# Patient Record
Sex: Female | Born: 1935 | Race: Black or African American | Hispanic: No | Smoking: Never smoker
Health system: Southern US, Community
[De-identification: ages and names within clinical notes are randomized; demographics above are authoritative.]

## PROBLEM LIST (undated history)

## (undated) DIAGNOSIS — H9190 Unspecified hearing loss, unspecified ear: Secondary | ICD-10-CM

## (undated) DIAGNOSIS — Z8489 Family history of other specified conditions: Secondary | ICD-10-CM

## (undated) DIAGNOSIS — E039 Hypothyroidism, unspecified: Secondary | ICD-10-CM

## (undated) DIAGNOSIS — I10 Essential (primary) hypertension: Secondary | ICD-10-CM

## (undated) DIAGNOSIS — I639 Cerebral infarction, unspecified: Secondary | ICD-10-CM

## (undated) DIAGNOSIS — M1990 Unspecified osteoarthritis, unspecified site: Secondary | ICD-10-CM

## (undated) DIAGNOSIS — J189 Pneumonia, unspecified organism: Secondary | ICD-10-CM

## (undated) DIAGNOSIS — R0602 Shortness of breath: Secondary | ICD-10-CM

## (undated) DIAGNOSIS — Z9289 Personal history of other medical treatment: Secondary | ICD-10-CM

## (undated) DIAGNOSIS — K219 Gastro-esophageal reflux disease without esophagitis: Secondary | ICD-10-CM

## (undated) DIAGNOSIS — F0390 Unspecified dementia without behavioral disturbance: Secondary | ICD-10-CM

## (undated) DIAGNOSIS — D649 Anemia, unspecified: Secondary | ICD-10-CM

## (undated) DIAGNOSIS — C50919 Malignant neoplasm of unspecified site of unspecified female breast: Secondary | ICD-10-CM

## (undated) DIAGNOSIS — R011 Cardiac murmur, unspecified: Secondary | ICD-10-CM

## (undated) DIAGNOSIS — I509 Heart failure, unspecified: Secondary | ICD-10-CM

## (undated) DIAGNOSIS — I4891 Unspecified atrial fibrillation: Secondary | ICD-10-CM

## (undated) DIAGNOSIS — E7800 Pure hypercholesterolemia, unspecified: Secondary | ICD-10-CM

## (undated) DIAGNOSIS — F419 Anxiety disorder, unspecified: Secondary | ICD-10-CM

## (undated) DIAGNOSIS — Z8719 Personal history of other diseases of the digestive system: Secondary | ICD-10-CM

## (undated) HISTORY — PX: BREAST BIOPSY: SHX20

## (undated) HISTORY — PX: BREAST LUMPECTOMY: SHX2

## (undated) HISTORY — PX: ABDOMINAL HYSTERECTOMY: SHX81

## (undated) HISTORY — PX: CATARACT EXTRACTION W/ INTRAOCULAR LENS  IMPLANT, BILATERAL: SHX1307

## (undated) HISTORY — PX: CARPAL TUNNEL RELEASE: SHX101

---

## 1987-09-29 DIAGNOSIS — E039 Hypothyroidism, unspecified: Secondary | ICD-10-CM

## 1987-09-29 HISTORY — DX: Hypothyroidism, unspecified: E03.9

## 1999-07-31 ENCOUNTER — Encounter: Payer: Self-pay | Admitting: Internal Medicine

## 1999-07-31 ENCOUNTER — Encounter: Admission: RE | Admit: 1999-07-31 | Discharge: 1999-07-31 | Payer: Self-pay | Admitting: Internal Medicine

## 2002-08-21 ENCOUNTER — Encounter: Admission: RE | Admit: 2002-08-21 | Discharge: 2002-08-21 | Payer: Self-pay | Admitting: Internal Medicine

## 2002-08-21 ENCOUNTER — Encounter: Payer: Self-pay | Admitting: Internal Medicine

## 2002-09-08 ENCOUNTER — Ambulatory Visit (HOSPITAL_COMMUNITY): Admission: RE | Admit: 2002-09-08 | Discharge: 2002-09-08 | Payer: Self-pay | Admitting: Internal Medicine

## 2002-12-25 ENCOUNTER — Ambulatory Visit (HOSPITAL_COMMUNITY): Admission: RE | Admit: 2002-12-25 | Discharge: 2002-12-25 | Payer: Self-pay | Admitting: *Deleted

## 2005-03-05 ENCOUNTER — Encounter: Admission: RE | Admit: 2005-03-05 | Discharge: 2005-03-05 | Payer: Self-pay | Admitting: Internal Medicine

## 2005-10-16 ENCOUNTER — Encounter: Admission: RE | Admit: 2005-10-16 | Discharge: 2005-10-16 | Payer: Self-pay | Admitting: Internal Medicine

## 2006-03-15 ENCOUNTER — Emergency Department (HOSPITAL_COMMUNITY): Admission: EM | Admit: 2006-03-15 | Discharge: 2006-03-15 | Payer: Self-pay | Admitting: Emergency Medicine

## 2008-01-05 ENCOUNTER — Encounter: Admission: RE | Admit: 2008-01-05 | Discharge: 2008-01-05 | Payer: Self-pay | Admitting: Internal Medicine

## 2010-03-26 ENCOUNTER — Encounter: Admission: RE | Admit: 2010-03-26 | Discharge: 2010-03-26 | Payer: Self-pay | Admitting: Internal Medicine

## 2012-06-28 ENCOUNTER — Other Ambulatory Visit: Payer: Self-pay | Admitting: Internal Medicine

## 2012-06-28 DIAGNOSIS — R109 Unspecified abdominal pain: Secondary | ICD-10-CM

## 2012-06-28 DIAGNOSIS — K30 Functional dyspepsia: Secondary | ICD-10-CM

## 2012-06-28 DIAGNOSIS — R079 Chest pain, unspecified: Secondary | ICD-10-CM

## 2012-07-04 ENCOUNTER — Ambulatory Visit
Admission: RE | Admit: 2012-07-04 | Discharge: 2012-07-04 | Disposition: A | Discharge status: Home / Self Care | Payer: Medicare Other | Source: Ambulatory Visit | Attending: Internal Medicine | Admitting: Internal Medicine

## 2012-07-04 ENCOUNTER — Other Ambulatory Visit: Payer: Self-pay | Admitting: Internal Medicine

## 2012-07-04 DIAGNOSIS — R079 Chest pain, unspecified: Secondary | ICD-10-CM

## 2012-07-04 DIAGNOSIS — K30 Functional dyspepsia: Secondary | ICD-10-CM

## 2012-07-04 DIAGNOSIS — R109 Unspecified abdominal pain: Secondary | ICD-10-CM

## 2014-01-04 ENCOUNTER — Ambulatory Visit
Admission: RE | Admit: 2014-01-04 | Discharge: 2014-01-04 | Disposition: A | Discharge status: Home / Self Care | Payer: Medicare Other | Source: Ambulatory Visit | Attending: Internal Medicine | Admitting: Internal Medicine

## 2014-01-04 ENCOUNTER — Other Ambulatory Visit: Payer: Self-pay | Admitting: Internal Medicine

## 2014-01-04 DIAGNOSIS — M8920 Other disorders of bone development and growth, unspecified site: Secondary | ICD-10-CM

## 2014-01-16 ENCOUNTER — Inpatient Hospital Stay (HOSPITAL_COMMUNITY)
Admission: EM | Admit: 2014-01-16 | Discharge: 2014-01-20 | DRG: 193 | Disposition: A | Discharge status: Home / Self Care | Payer: Medicare Other | Attending: Internal Medicine | Admitting: Internal Medicine

## 2014-01-16 ENCOUNTER — Encounter (HOSPITAL_COMMUNITY): Payer: Self-pay | Admitting: Emergency Medicine

## 2014-01-16 ENCOUNTER — Inpatient Hospital Stay (HOSPITAL_COMMUNITY): Payer: Medicare Other

## 2014-01-16 DIAGNOSIS — R5383 Other fatigue: Secondary | ICD-10-CM

## 2014-01-16 DIAGNOSIS — N183 Chronic kidney disease, stage 3 unspecified: Secondary | ICD-10-CM

## 2014-01-16 DIAGNOSIS — I4892 Unspecified atrial flutter: Secondary | ICD-10-CM | POA: Diagnosis present

## 2014-01-16 DIAGNOSIS — D509 Iron deficiency anemia, unspecified: Secondary | ICD-10-CM | POA: Diagnosis present

## 2014-01-16 DIAGNOSIS — I1310 Hypertensive heart and chronic kidney disease without heart failure, with stage 1 through stage 4 chronic kidney disease, or unspecified chronic kidney disease: Secondary | ICD-10-CM | POA: Diagnosis present

## 2014-01-16 DIAGNOSIS — F0391 Unspecified dementia with behavioral disturbance: Secondary | ICD-10-CM | POA: Diagnosis present

## 2014-01-16 DIAGNOSIS — H9190 Unspecified hearing loss, unspecified ear: Secondary | ICD-10-CM | POA: Diagnosis present

## 2014-01-16 DIAGNOSIS — J189 Pneumonia, unspecified organism: Principal | ICD-10-CM | POA: Diagnosis present

## 2014-01-16 DIAGNOSIS — I214 Non-ST elevation (NSTEMI) myocardial infarction: Secondary | ICD-10-CM

## 2014-01-16 DIAGNOSIS — R5381 Other malaise: Secondary | ICD-10-CM

## 2014-01-16 DIAGNOSIS — Z79899 Other long term (current) drug therapy: Secondary | ICD-10-CM

## 2014-01-16 DIAGNOSIS — R7989 Other specified abnormal findings of blood chemistry: Secondary | ICD-10-CM | POA: Diagnosis present

## 2014-01-16 DIAGNOSIS — D649 Anemia, unspecified: Secondary | ICD-10-CM

## 2014-01-16 DIAGNOSIS — N289 Disorder of kidney and ureter, unspecified: Secondary | ICD-10-CM | POA: Diagnosis present

## 2014-01-16 DIAGNOSIS — I4891 Unspecified atrial fibrillation: Secondary | ICD-10-CM | POA: Diagnosis present

## 2014-01-16 DIAGNOSIS — Z7901 Long term (current) use of anticoagulants: Secondary | ICD-10-CM

## 2014-01-16 DIAGNOSIS — G9340 Encephalopathy, unspecified: Secondary | ICD-10-CM | POA: Diagnosis present

## 2014-01-16 DIAGNOSIS — F03918 Unspecified dementia, unspecified severity, with other behavioral disturbance: Secondary | ICD-10-CM | POA: Diagnosis present

## 2014-01-16 DIAGNOSIS — M1710 Unilateral primary osteoarthritis, unspecified knee: Secondary | ICD-10-CM | POA: Diagnosis present

## 2014-01-16 DIAGNOSIS — I119 Hypertensive heart disease without heart failure: Secondary | ICD-10-CM

## 2014-01-16 DIAGNOSIS — R531 Weakness: Secondary | ICD-10-CM | POA: Diagnosis present

## 2014-01-16 DIAGNOSIS — R778 Other specified abnormalities of plasma proteins: Secondary | ICD-10-CM | POA: Diagnosis present

## 2014-01-16 DIAGNOSIS — IMO0002 Reserved for concepts with insufficient information to code with codable children: Secondary | ICD-10-CM

## 2014-01-16 DIAGNOSIS — E039 Hypothyroidism, unspecified: Secondary | ICD-10-CM | POA: Diagnosis present

## 2014-01-16 HISTORY — DX: Anemia, unspecified: D64.9

## 2014-01-16 HISTORY — DX: Personal history of other medical treatment: Z92.89

## 2014-01-16 HISTORY — DX: Unspecified hearing loss, unspecified ear: H91.90

## 2014-01-16 HISTORY — DX: Gastro-esophageal reflux disease without esophagitis: K21.9

## 2014-01-16 HISTORY — DX: Essential (primary) hypertension: I10

## 2014-01-16 HISTORY — DX: Cardiac murmur, unspecified: R01.1

## 2014-01-16 HISTORY — DX: Pure hypercholesterolemia, unspecified: E78.00

## 2014-01-16 HISTORY — DX: Unspecified dementia, unspecified severity, without behavioral disturbance, psychotic disturbance, mood disturbance, and anxiety: F03.90

## 2014-01-16 HISTORY — DX: Unspecified osteoarthritis, unspecified site: M19.90

## 2014-01-16 HISTORY — DX: Unspecified atrial fibrillation: I48.91

## 2014-01-16 HISTORY — DX: Malignant neoplasm of unspecified site of unspecified female breast: C50.919

## 2014-01-16 HISTORY — DX: Cerebral infarction, unspecified: I63.9

## 2014-01-16 HISTORY — DX: Pneumonia, unspecified organism: J18.9

## 2014-01-16 HISTORY — DX: Family history of other specified conditions: Z84.89

## 2014-01-16 HISTORY — DX: Personal history of other diseases of the digestive system: Z87.19

## 2014-01-16 HISTORY — DX: Anxiety disorder, unspecified: F41.9

## 2014-01-16 HISTORY — DX: Hypothyroidism, unspecified: E03.9

## 2014-01-16 LAB — CBC
HCT: 34.5 % — ABNORMAL LOW (ref 36.0–46.0)
HEMOGLOBIN: 10.4 g/dL — AB (ref 12.0–15.0)
MCH: 29.1 pg (ref 26.0–34.0)
MCHC: 30.1 g/dL (ref 30.0–36.0)
MCV: 96.6 fL (ref 78.0–100.0)
Platelets: 380 10*3/uL (ref 150–400)
RBC: 3.57 MIL/uL — AB (ref 3.87–5.11)
RDW: 15.5 % (ref 11.5–15.5)
WBC: 8 10*3/uL (ref 4.0–10.5)

## 2014-01-16 LAB — URINALYSIS, ROUTINE W REFLEX MICROSCOPIC
Bilirubin Urine: NEGATIVE
Glucose, UA: NEGATIVE mg/dL
Hgb urine dipstick: NEGATIVE
Ketones, ur: NEGATIVE mg/dL
LEUKOCYTES UA: NEGATIVE
NITRITE: NEGATIVE
PH: 5.5 (ref 5.0–8.0)
PROTEIN: NEGATIVE mg/dL
SPECIFIC GRAVITY, URINE: 1.021 (ref 1.005–1.030)
Urobilinogen, UA: 1 mg/dL (ref 0.0–1.0)

## 2014-01-16 LAB — COMPREHENSIVE METABOLIC PANEL
ALBUMIN: 2.2 g/dL — AB (ref 3.5–5.2)
ALK PHOS: 96 U/L (ref 39–117)
ALT: 7 U/L (ref 0–35)
AST: 13 U/L (ref 0–37)
BILIRUBIN TOTAL: 0.2 mg/dL — AB (ref 0.3–1.2)
BUN: 44 mg/dL — AB (ref 6–23)
CO2: 25 mEq/L (ref 19–32)
Calcium: 9.7 mg/dL (ref 8.4–10.5)
Chloride: 102 mEq/L (ref 96–112)
Creatinine, Ser: 1.22 mg/dL — ABNORMAL HIGH (ref 0.50–1.10)
GFR calc Af Amer: 44 mL/min — ABNORMAL LOW (ref >90)
GFR calc non Af Amer: 38 mL/min — ABNORMAL LOW (ref >90)
GLUCOSE: 127 mg/dL — AB (ref 70–99)
Potassium: 4.5 mEq/L (ref 3.7–5.3)
Sodium: 141 mEq/L (ref 137–147)
TOTAL PROTEIN: 7.3 g/dL (ref 6.0–8.3)

## 2014-01-16 LAB — I-STAT TROPONIN, ED: TROPONIN I, POC: 0.3 ng/mL — AB (ref 0.00–0.08)

## 2014-01-16 LAB — CK TOTAL AND CKMB (NOT AT ARMC)
CK, MB: 1.9 ng/mL (ref 0.3–4.0)
RELATIVE INDEX: INVALID (ref 0.0–2.5)
Total CK: 39 U/L (ref 7–177)

## 2014-01-16 LAB — DIGOXIN LEVEL: Digoxin Level: 1.4 ng/mL (ref 0.8–2.0)

## 2014-01-16 LAB — CBG MONITORING, ED: Glucose-Capillary: 122 mg/dL — ABNORMAL HIGH (ref 70–99)

## 2014-01-16 LAB — TROPONIN I

## 2014-01-16 LAB — POC OCCULT BLOOD, ED: Fecal Occult Bld: NEGATIVE

## 2014-01-16 MED ORDER — SODIUM CHLORIDE 0.9 % IV BOLUS (SEPSIS)
1000.0000 mL | Freq: Once | INTRAVENOUS | Status: AC
  Administered 2014-01-16: 1000 mL via INTRAVENOUS

## 2014-01-16 MED ORDER — SIMVASTATIN 20 MG PO TABS
20.0000 mg | ORAL_TABLET | Freq: Every day | ORAL | Status: DC
  Administered 2014-01-17 – 2014-01-19 (×3): 20 mg via ORAL
  Filled 2014-01-16 (×4): qty 1

## 2014-01-16 MED ORDER — DEXTROSE 5 % IV SOLN
1.0000 g | INTRAVENOUS | Status: DC
  Administered 2014-01-16 – 2014-01-19 (×4): 1 g via INTRAVENOUS
  Filled 2014-01-16 (×5): qty 10

## 2014-01-16 MED ORDER — HALOPERIDOL 0.5 MG PO TABS
0.5000 mg | ORAL_TABLET | Freq: Every evening | ORAL | Status: DC | PRN
  Filled 2014-01-16: qty 1

## 2014-01-16 MED ORDER — METOPROLOL SUCCINATE ER 50 MG PO TB24
50.0000 mg | ORAL_TABLET | Freq: Every day | ORAL | Status: DC
  Administered 2014-01-17: 50 mg via ORAL
  Filled 2014-01-16 (×2): qty 1

## 2014-01-16 MED ORDER — ASPIRIN 81 MG PO CHEW
324.0000 mg | CHEWABLE_TABLET | Freq: Once | ORAL | Status: AC
  Administered 2014-01-16: 324 mg via ORAL
  Filled 2014-01-16: qty 4

## 2014-01-16 MED ORDER — THIAMINE HCL 100 MG/ML IJ SOLN
100.0000 mg | Freq: Once | INTRAMUSCULAR | Status: AC
  Administered 2014-01-16: 100 mg via INTRAVENOUS
  Filled 2014-01-16: qty 2

## 2014-01-16 MED ORDER — DIGOXIN 0.0625 MG HALF TABLET
0.0625 mg | ORAL_TABLET | Freq: Every day | ORAL | Status: DC
  Administered 2014-01-17: 0.0625 mg via ORAL
  Filled 2014-01-16 (×2): qty 1

## 2014-01-16 MED ORDER — LEVOTHYROXINE SODIUM 175 MCG PO TABS
175.0000 ug | ORAL_TABLET | Freq: Every day | ORAL | Status: DC
  Administered 2014-01-17 – 2014-01-20 (×4): 175 ug via ORAL
  Filled 2014-01-16 (×5): qty 1

## 2014-01-16 MED ORDER — DEXTROSE 5 % IV SOLN
500.0000 mg | INTRAVENOUS | Status: DC
  Administered 2014-01-16 – 2014-01-17 (×2): 500 mg via INTRAVENOUS
  Filled 2014-01-16 (×3): qty 500

## 2014-01-16 NOTE — ED Notes (Addendum)
Patient taken to Xray. Food tray ordered for patient.

## 2014-01-16 NOTE — ED Notes (Signed)
Cardiologist at the bedside

## 2014-01-16 NOTE — ED Notes (Addendum)
Cardiologist Hardling at the bedside.

## 2014-01-16 NOTE — ED Notes (Signed)
PA at the bedside. CBG taken and resulted at 122.

## 2014-01-16 NOTE — ED Notes (Signed)
Pt's family member states that for the last 2 weeks pt has been a little more confused than normal, sleeping a lot, and not eating well.  Pt states she doesn't feel well.

## 2014-01-16 NOTE — ED Notes (Signed)
Dr. Jacubowitz at the bedside 

## 2014-01-16 NOTE — ED Notes (Signed)
In and Out completed for Urine specimen.

## 2014-01-16 NOTE — H&P (Signed)
[facility] History and Physical  [name] VPX:[id number] DOB: November 07, 1920 DOA: 01/16/2014  Referring physician: EDP PCP: Myriam Jacobson, MD   Chief Complaint: brought in by daughter for generalized weakness over the last few weeks.   HPI: Hannah Manning is a 80 y.o. female with prior h/o hypertension, afib on pradaxa, was broughti n for generalized weakness since many weeks. On arrival to ED, she was found to be comfortable. Her only complaint was right knee pain. She denies any chest pain or sob. Her troponin was elevated. Cardiology consulted. She was referred to medical service for further evaluation.    Review of Systems:  Cannot be obtained. She has dementia.  .   Past Medical History  Diagnosis Date  . Hypothyroid 1989  . Hypertension   . A-fib     For about 5 years  . Arthritis   . Cancer   . Hard of hearing    Past Surgical History  Procedure Laterality Date  . Breast surgery    . Abdominal hysterectomy     Social History:  reports that she has never smoked. She does not have any smokeless tobacco history on file. She reports that she does not drink alcohol or use illicit drugs.  No Known Allergies  No family history on file.   Prior to Admission medications   Medication Sig Start Date End Date Taking? Authorizing Provider  acetaminophen (TYLENOL) 500 MG tablet Take 1,000 mg by mouth every 6 (six) hours as needed for mild pain.   Yes Historical Provider, MD  allopurinol (ZYLOPRIM) 100 MG tablet Take 100 mg by mouth daily.   Yes Historical Provider, MD  beta carotene w/minerals (OCUVITE) tablet Take 1 tablet by mouth daily.   Yes Historical Provider, MD  dabigatran (PRADAXA) 75 MG CAPS capsule Take 75 mg by mouth 2 (two) times daily.   Yes Historical Provider, MD  digoxin (LANOXIN) 0.125 MG tablet Take 0.125 mg by mouth daily.   Yes Historical Provider, MD  haloperidol (HALDOL) 0.5 MG tablet Take 0.5 mg by mouth daily.   Yes  Historical Provider, MD  hydrochlorothiazide (HYDRODIURIL) 25 MG tablet Take 25 mg by mouth daily.   Yes Historical Provider, MD  levothyroxine (SYNTHROID, LEVOTHROID) 175 MCG tablet Take 175 mcg by mouth daily before breakfast.   Yes Historical Provider, MD  metoprolol succinate (TOPROL-XL) 50 MG 24 hr tablet Take 50 mg by mouth daily. Take with or immediately following a meal.   Yes Historical Provider, MD  Polyethyl Glycol-Propyl Glycol (SYSTANE OP) Place 1 drop into both eyes daily.   Yes Historical Provider, MD  simvastatin (ZOCOR) 20 MG tablet Take 20 mg by mouth daily.   Yes Historical Provider, MD   Physical Exam: Filed Vitals:   01/16/14 1715  BP: 129/69  Pulse: 107  Temp:   Resp: 25    BP 129/69  Pulse 107  Temp(Src) 97.4 F (36.3 C) (Oral)  Resp 25  Ht 5\' 4"  (1.626 m)  Wt 70.308 kg (155 lb)  BMI 26.59 kg/m2  SpO2 95%  General:  Appears calm and comfortable Eyes: PERRL, normal lids, irises & conjunctiva ENT: hard of hearning.  Neck: no LAD, masses or thyromegaly Cardiovascular:irregular. , no m/r/g. No LE edema Respiratory: CTA bilaterally, no w/r/r. Normal respiratory effort. Abdomen: soft, ntnd Skin: no rash or induration seen on limited exam Musculoskeletal: right knee swollen. Neurologic: grossly non-focal. Pleasantly confused.          Labs on Admission:  Basic Metabolic Panel:  Recent Labs Lab 01/16/14 1202  NA 141  K 4.5  CL 102  CO2 25  GLUCOSE 127*  BUN 44*  CREATININE 1.22*  CALCIUM 9.7   Liver Function Tests:  Recent Labs Lab 01/16/14 1202  AST 13  ALT 7  ALKPHOS 96  BILITOT 0.2*  PROT 7.3  ALBUMIN 2.2*   No results found for this basename: LIPASE, AMYLASE,  in the last 168 hours No results found for this basename: AMMONIA,  in the last 168 hours CBC:  Recent Labs Lab 01/16/14 1202  WBC 8.0  HGB 10.4*  HCT 34.5*  MCV 96.6  PLT 380   Cardiac Enzymes:  Recent Labs Lab 01/16/14 1558  CKTOTAL 39  CKMB 1.9    TROPONINI <0.30    BNP (last 3 results) No results found for this basename: PROBNP,  in the last 8760 hours CBG:  Recent Labs Lab 01/16/14 1203  GLUCAP 122*    Radiological Exams on Admission: Dg Chest Port 1 View  01/16/2014   CLINICAL DATA:  Weakness and hypertension  EXAM: PORTABLE CHEST - 1 VIEW  COMPARISON:  03/05/2005  FINDINGS: Cardiac enlargement.  Negative for heart failure  Mild left lower lobe airspace disease and small left effusion. Possible pneumonia.  Surgical clips in the left axilla and left breast.  IMPRESSION: Left lower lobe airspace disease and small left effusion, question pneumonia   Electronically Signed   By: Franchot Gallo M.D.   On: 01/16/2014 15:27   Dg Knee Complete 4 Views Right  01/16/2014   CLINICAL DATA:  Right knee pain and swelling  EXAM: RIGHT KNEE - COMPLETE 4+ VIEW  COMPARISON:  DG KNEE 2 VIEWS*R* dated 03/26/2010  FINDINGS: Moderate narrowing of the medial compartment and severe narrowing of the lateral compartment with mild medial and moderate to severe lateral osteophyte formation. Moderate narrowing of the patellofemoral compartment with moderate osteophyte formation. Moderate joint effusion present. No fracture or dislocation. Extensive below the knee and above the knee vascular calcification present.  IMPRESSION: Moderate to severe arthritis, mildly worse in the medial and patellofemoral compartments when compared to the prior study.   Electronically Signed   By: Skipper Cliche M.D.   On: 01/16/2014 16:33    EKG: atrial fib  Assessment/Plan Principal Problem:   Weakness Active Problems:   NSTEMI (non-ST elevated myocardial infarction)   Hard of hearing   Anemia   Renal insufficiency   A-fib   1. Generalized weakness/ CAP; - started her IV rocephin and IV zithromax. , blood cultures and urine for strep and legionella ordered.  - nasal oxygen as needed.  - swallow eval  2. Elevated point of care troponin: - she is chest pain free and  denies any sob.  - cardiology consulted.  - ordered ECHO  And serial troponins.   3. Right Knee pain: X rays of the knee ordered, showed knee effusion.  Please call ortho in am for arthrocentesis.   4. Atrial fibrillation: - rate controlled and on pradaxa. Which is being held ofr anemia.   5. Anemia: Appears to be basline, continue to monitor.   6. Renal insufficiency: Unclear if its acute or chronic. No baseline values to compare.     DVT prophylaxis.      Code Status: presumed full code Family Communication: daughter at bedside Disposition Plan: pending further evaluation   Time spent: 3 min  [facility] Pager [phone]

## 2014-01-16 NOTE — ED Notes (Signed)
Results of troponin given to tech, Malachi Carl for MD in [place]

## 2014-01-16 NOTE — ED Provider Notes (Signed)
History is obtained from patient's daughter. Patient has had diminished appetite and confusion since Autumnof 2014 she is become worse, sleeping more over the past 2 weeks with diminished appetite. Patient offers no specific complaint. No pain. No fever. On exam no distress lungs clear auscultation heart regular irregularly irregular with a grade 2/6 systolic  murmur abdomen soft nontender  Date: 01/16/2014  Rate: 70  Rhythm: atrial fibrillation  QRS Axis: left  Intervals: normal  ST/T Wave abnormalities: nonspecific T wave changes  Conduction Disutrbances:nonspecific intraventricular conduction delay  Narrative Interpretation:   Old EKG Reviewed: none available    Orlie Dakin, MD 01/16/14 1452

## 2014-01-16 NOTE — ED Notes (Signed)
Pt returned from X-ray.  

## 2014-01-16 NOTE — Consult Note (Signed)
CARDIOLOGY CONSULT NOTE   Patient ID: Hannah Manning MRN: [id number] DOB/AGE: May 13, 1921 80 y.o.  Admit date: 01/16/2014  Primary Physician   ROBERTS, Sharol Given, MD Primary Cardiologist   New Reason for Consultation   Atrial fibrillation  FWY:OVZCHYI Hannah Manning Morning is a 80 y.o. female with no history of atrial fibrillation. This has been managed by her primary MD and she has been anticoagulated, with Pradaxa for the last 3 years.   For the past week or so, pt has been less active, staying in bed. Appears tired and weak. Delusions at times. Eating poorly. No acute illnesses known. No dysuria, URI symptoms. Her ankles have been puffy and they have been painful. She also has a swollen and painful right knee.  Her daughter was becoming more concerned about her weakness and contacted her primary MD, who recommended she come to the ER. In the ER, her troponin is slightly elevated and cardiology was asked to evaluate her.   Ms. Delatte denies chest pain or SOB. She occasionally is aware of her irregular heart rate, but no all the time. She exerts herself very little, but denies DOE. She is not SOB at rest but oxygen sats on room air are 93%. Her daughter is not aware of a heart murmur or anemia. No echo in the system and no old labs are currently available. She is resting comfortably.  Past Medical History  Diagnosis Date  . Hypothyroid 1989  . Hypertension   . A-fib     For about 5 years  . Arthritis   . Cancer   . Hard of hearing      Past Surgical History  Procedure Laterality Date  . Breast surgery    . Abdominal hysterectomy     No Known Allergies  I have reviewed the patient's current medications Prior to Admission medications   Medication Sig Start Date End Date Taking? Authorizing Provider  acetaminophen (TYLENOL) 500 MG tablet Take 1,000 mg by mouth every 6 (six) hours as needed for mild pain.   Yes Historical Provider, MD  allopurinol (ZYLOPRIM) 100 MG  tablet Take 100 mg by mouth daily.   Yes Historical Provider, MD  beta carotene w/minerals (OCUVITE) tablet Take 1 tablet by mouth daily.   Yes Historical Provider, MD  dabigatran (PRADAXA) 75 MG CAPS capsule Take 75 mg by mouth 2 (two) times daily.   Yes Historical Provider, MD  digoxin (LANOXIN) 0.125 MG tablet Take 0.125 mg by mouth daily.   Yes Historical Provider, MD  haloperidol (HALDOL) 0.5 MG tablet Take 0.5 mg by mouth daily.   Yes Historical Provider, MD  hydrochlorothiazide (HYDRODIURIL) 25 MG tablet Take 25 mg by mouth daily.   Yes Historical Provider, MD  levothyroxine (SYNTHROID, LEVOTHROID) 175 MCG tablet Take 175 mcg by mouth daily before breakfast.   Yes Historical Provider, MD  metoprolol succinate (TOPROL-XL) 50 MG 24 hr tablet Take 50 mg by mouth daily. Take with or immediately following a meal.   Yes Historical Provider, MD  Polyethyl Glycol-Propyl Glycol (SYSTANE OP) Place 1 drop into both eyes daily.   Yes Historical Provider, MD  simvastatin (ZOCOR) 20 MG tablet Take 20 mg by mouth daily.   Yes Historical Provider, MD     History   Social History  . Marital Status: Widowed    Spouse Name: N/A    Number of Children: N/A  . Years of Education: N/A   Occupational History  . Retired  Social History Main Topics  . Smoking status: Never Smoker   . Smokeless tobacco: Not on file  . Alcohol Use: No  . Drug Use: No  . Sexual Activity: Not on file   Other Topics Concern  . Not on file   Social History Narrative   Pt lives alone, has a caregiver daily. Was using a walker, but not mostly using a wheelchair. Daughter checks on her frequently.    Family Status  Relation Status Death Age  . Mother Deceased     No hx CAD  . Father Deceased     No hx CAD  . Sister Deceased     Died of an MI    ROS:  Full 14 point review of systems complete and found to be negative unless listed above.  Physical Exam: Blood pressure 129/69, pulse 107, temperature 97.4 F (36.3  C), temperature source Oral, resp. rate 25, height 5\' 4"  (1.626 m), weight 155 lb (70.308 kg), SpO2 95.00%.  General: Well developed, well nourished, female in no acute distress Head: Eyes PERRLA, No xanthomas.   Normocephalic and atraumatic, oropharynx without edema or exudate. Dentition: poor Lungs: rales bases, mostly on the left Heart: Heart irregular rate and rhythm with S1, S2; probable AS murmur. pulses are 2+ upper extrem, decreased in LE Neck: No carotid bruits. No lymphadenopathy.  JVD at 9 cm. Abdomen: Bowel sounds present, abdomen soft and non-tender without masses or hernias noted. Msk:  No spine or cva tenderness. No weakness, no joint deformities or effusions. Extremities: No clubbing or cyanosis. Right knee small effusion and pedal edema.  Neuro: Alert and oriented X 1. No focal deficits noted. Psych:  Good affect, responds appropriately Skin: No rashes or lesions noted.  Labs:  Lab Results  Component Value Date   WBC 8.0 01/16/2014   HGB 10.4* 01/16/2014   HCT 34.5* 01/16/2014   MCV 96.6 01/16/2014   PLT 380 01/16/2014     Recent Labs Lab 01/16/14 1202  NA 141  K 4.5  CL 102  CO2 25  BUN 44*  CREATININE 1.22*  CALCIUM 9.7  PROT 7.3  BILITOT 0.2*  ALKPHOS 96  ALT 7  AST 13  GLUCOSE 127*  ALBUMIN 2.2*   Lab Results  Component Value Date   DIGOXIN 1.4 01/16/2014    Recent Labs  01/16/14 1407  TROPIPOC 0.30*   No results found for this basename: CKTOTAL,  CKMB,  CKMBINDEX,  TROPONINI   ECG: 16-Jan-2014 13:50:21  Atrial fibrillation Incomplete RBBB and LAFB Consider right ventricular hypertrophy Consider anterior infarct Nonspecific T abnormalities, lateral leads Vent. rate 69 BPM PR interval * ms QRS duration 102 ms QT/QTc 379/406 ms P-R-T axes -1 -75 93  Radiology:  Dg Chest Port 1 View 01/16/2014   CLINICAL DATA:  Weakness and hypertension  EXAM: PORTABLE CHEST - 1 VIEW  COMPARISON:  03/05/2005  FINDINGS: Cardiac enlargement.  Negative for  heart failure  Mild left lower lobe airspace disease and small left effusion. Possible pneumonia.  Surgical clips in the left axilla and left breast.  IMPRESSION: Left lower lobe airspace disease and small left effusion, question pneumonia   Electronically Signed   By: Franchot Gallo M.D.   On: 01/16/2014 15:27   Dg Knee Complete 4 Views Right 01/16/2014   CLINICAL DATA:  Right knee pain and swelling  EXAM: RIGHT KNEE - COMPLETE 4+ VIEW  COMPARISON:  DG KNEE 2 VIEWS*R* dated 03/26/2010  FINDINGS: Moderate narrowing of the medial compartment  and severe narrowing of the lateral compartment with mild medial and moderate to severe lateral osteophyte formation. Moderate narrowing of the patellofemoral compartment with moderate osteophyte formation. Moderate joint effusion present. No fracture or dislocation. Extensive below the knee and above the knee vascular calcification present.  IMPRESSION: Moderate to severe arthritis, mildly worse in the medial and patellofemoral compartments when compared to the prior study.   Electronically Signed   By: Skipper Cliche M.D.   On: 01/16/2014 16:33    ASSESSMENT AND PLAN:   The patient was seen today by Dr. Ellyn Hack, the patient evaluated and the data reviewed.  Principal Problem:   Weakness - mgt per primary MD, consider TSH  Active Problems:   Anemia - workup per IM, but will order guaiac stools and hold Pradaxa for now.    Renal insufficiency - per IM, seems slightly dehydrated but would hydrate gently. Will decrease digoxin dose     NSTEMI (non-ST elevated myocardial infarction) - ck CKMB and follow Troponin levels. Ck echo, Continue Toprol XL 50 mg, add statin if repeat troponin elevated.     Hard of hearing - per IM    A-fib - rate OK, follow, on BB; decrease digoxin with borderline high level and renal insufficiency.  Signed: Lonn Georgia, PA-C 01/16/2014 5:23 PM Beeper [phone]  Co-Sign MD

## 2014-01-16 NOTE — Consult Note (Signed)
I have seen and evaluated the patient this PM along with Lonn Georgia, PA-C. I agree with her findings, examination as well as impression recommendations.  The patient is a very pleasant relatively healthy appearing elderly woman with long-standing atrial fibrillation treated by her PCP. She is on digoxin and on Pradaxa for anticoagulation. She is brought to the emergency room for profound weakness and fatigue.  She herself is a poor historian, and is very hard of hearing. I don't know that she even understands what I am asking her. Her daughter does act as an Tourist information centre manager for her, but I'm not sure, to that she has either. She carries a diagnosis of atrial fibrillation, and is in A. fib, but does not notice any irregular or rapid heartbeat. Sometimes when she lies down flat she'll lose her heart rate goes faster and will be a little more short of breath. Otherwise really denies any significant symptoms. She has mild ankle puffiness in what sounds like at least aortic sclerosis on exam. We were counseled for a mild troponin elevation on POC labs, but followup labs are negative.  Would like of any significant anginal or dyspnea symptoms, I am not inclined to place 2 with good ability at a point of care testing is positive when the standard lab value is normal. Her digoxin level is okay, but I do agree that we may want to reduce the dosing interval to every other day while her renal function is mildly worsened.  She's actually are pretty good regimen with a beta blocker for rate control, but with aortic stenosis or sclerosis on exam, an echocardiogram would be helpful. We have ordered one for tomorrow.  I would stop Pradaxa for now based on her anemia until it is resolved. Would consider the possibility of side effect to Pradaxa leading to her poor by mouth intake over last few days.  We will followup on the results of the echocardiogram, and be available if needed for assistance with her  long-standing atrial fibrillation. Based on discussion with the daughter, they're not overly interested in aggressive evaluation, i.e. stress tests for ischemia, unless the echocardiogram is very concerning.  We will follow.  Leonie Man, M.D., M.S. Interventional Cardiologist  [facility] Pager # [phone] 01/16/2014

## 2014-01-16 NOTE — ED Notes (Signed)
MD Jacubowitz at the bedside   

## 2014-01-16 NOTE — ED Provider Notes (Signed)
CSN: [id number]     Arrival date & time 01/16/14  1112 History   First MD Initiated Contact with Patient 01/16/14 1155     Chief Complaint  Patient presents with  . Altered Mental Status  . Anorexia     (Consider location/radiation/quality/duration/timing/severity/associated sxs/prior Treatment) HPI Comments: Patient is 80 year old female with PMHx of HTN, AFib and breast cancer who presents to the ED with her daughter who provides the history.  She reports that over the past 2 weeks her mother has been sleeping more than usual, with generalized weakness and confusion as well.  She reports that she has also noted that her appetite has been less than normal as well.  She states that she does not complain of anything, besides "not feeling well".  She reports a history of chronic knee pain which is the only thing that she complains of.  She called her PCP, Dr. Mancel Bale and he is concerned that she may have an UTI.  She reports some anuria.  She denies headache, dizziness, nausea, vomiting, chest pain, shortness of breath, abdominal pain, focal weakness or falling.  Patient is a 80 y.o. female presenting with altered mental status. The history is provided by a relative. No language interpreter was used.  Altered Mental Status Presenting symptoms: confusion and lethargy   Severity:  Moderate Most recent episode:  More than 2 days ago Episode history:  Continuous Duration:  2 weeks Timing:  Constant Progression:  Waxing and waning Chronicity:  New Context: not dementia, not head injury, taking medications as prescribed, not a recent change in medication, not a recent illness and not a recent infection   Associated symptoms: bladder incontinence, decreased appetite and weakness   Associated symptoms: no abdominal pain, no difficulty breathing, no fever, no headaches, no nausea, no rash, no slurred speech, no visual change and no vomiting     Past Medical History  Diagnosis Date  . Thyroid  disease   . Hypertension   . A-fib   . Arthritis   . Cancer    Past Surgical History  Procedure Laterality Date  . Breast surgery    . Abdominal hysterectomy     No family history on file. History  Substance Use Topics  . Smoking status: Never Smoker   . Smokeless tobacco: Not on file  . Alcohol Use: No   OB History   Grav Para Term Preterm Abortions TAB SAB Ect Mult Living                 Review of Systems  Constitutional: Positive for decreased appetite. Negative for fever.  Gastrointestinal: Negative for nausea, vomiting and abdominal pain.  Genitourinary: Positive for bladder incontinence.  Skin: Negative for rash.  Neurological: Positive for weakness. Negative for headaches.  Psychiatric/Behavioral: Positive for confusion.  All other systems reviewed and are negative.     Allergies  Review of patient's allergies indicates no known allergies.  Home Medications   Prior to Admission medications   Medication Sig Start Date End Date Taking? Authorizing Provider  acetaminophen (TYLENOL) 500 MG tablet Take 1,000 mg by mouth every 6 (six) hours as needed for mild pain.   Yes Historical Provider, MD  allopurinol (ZYLOPRIM) 100 MG tablet Take 100 mg by mouth daily.   Yes Historical Provider, MD  beta carotene w/minerals (OCUVITE) tablet Take 1 tablet by mouth daily.   Yes Historical Provider, MD  dabigatran (PRADAXA) 75 MG CAPS capsule Take 75 mg by mouth 2 (two) times  daily.   Yes Historical Provider, MD  digoxin (LANOXIN) 0.125 MG tablet Take 0.125 mg by mouth daily.   Yes Historical Provider, MD  haloperidol (HALDOL) 0.5 MG tablet Take 0.5 mg by mouth daily.   Yes Historical Provider, MD  hydrochlorothiazide (HYDRODIURIL) 25 MG tablet Take 25 mg by mouth daily.   Yes Historical Provider, MD  levothyroxine (SYNTHROID, LEVOTHROID) 175 MCG tablet Take 175 mcg by mouth daily before breakfast.   Yes Historical Provider, MD  metoprolol succinate (TOPROL-XL) 50 MG 24 hr  tablet Take 50 mg by mouth daily. Take with or immediately following a meal.   Yes Historical Provider, MD  Polyethyl Glycol-Propyl Glycol (SYSTANE OP) Place 1 drop into both eyes daily.   Yes Historical Provider, MD  simvastatin (ZOCOR) 20 MG tablet Take 20 mg by mouth daily.   Yes Historical Provider, MD   BP 144/61  Pulse 75  Temp(Src) 97.4 F (36.3 C) (Oral)  Resp 18  Ht 5\' 4"  (1.626 m)  Wt 155 lb (70.308 kg)  BMI 26.59 kg/m2  SpO2 93% Physical Exam  Nursing note and vitals reviewed. Constitutional: She appears well-developed and well-nourished. No distress.  HENT:  Head: Normocephalic and atraumatic.  Right Ear: External ear normal.  Left Ear: External ear normal.  Nose: Nose normal.  Mouth/Throat: Oropharynx is clear and moist. No oropharyngeal exudate.  Eyes: Conjunctivae are normal. Pupils are equal, round, and reactive to light. No scleral icterus.  Neck: Normal range of motion. Neck supple.  Cardiovascular: Regular rhythm and normal pulses.  Bradycardia present.  Exam reveals no gallop and no friction rub.   Murmur heard.  Crescendo systolic murmur is present with a grade of 3/6  Pulmonary/Chest: Effort normal and breath sounds normal. No respiratory distress. She has no wheezes. She has no rales. She exhibits no tenderness.  Abdominal: Soft. Bowel sounds are normal. She exhibits no distension. There is no tenderness. There is no rebound and no guarding.  Musculoskeletal: Normal range of motion. She exhibits edema. She exhibits no tenderness.  Mild 2+ pitting edema  Lymphadenopathy:    She has no cervical adenopathy.  Neurological: She is alert. She exhibits normal muscle tone. Coordination normal.  Skin: Skin is warm and dry. No rash noted. No erythema. No pallor.  Psychiatric: She has a normal mood and affect. Her behavior is normal. Judgment and thought content normal.    ED Course  Procedures (including critical care time) Labs Review Labs Reviewed  CBC -  Abnormal; Notable for the following:    RBC 3.57 (*)    Hemoglobin 10.4 (*)    HCT 34.5 (*)    All other components within normal limits  COMPREHENSIVE METABOLIC PANEL - Abnormal; Notable for the following:    Glucose, Bld 127 (*)    BUN 44 (*)    Creatinine, Ser 1.22 (*)    Albumin 2.2 (*)    Total Bilirubin 0.2 (*)    GFR calc non Af Amer 38 (*)    GFR calc Af Amer 44 (*)    All other components within normal limits  CBG MONITORING, ED - Abnormal; Notable for the following:    Glucose-Capillary 122 (*)    All other components within normal limits  URINE CULTURE  URINALYSIS, ROUTINE W REFLEX MICROSCOPIC    Imaging Review No results found.   EKG Interpretation None     Results for orders placed during the hospital encounter of 01/16/14  CBC      Result Value  Ref Range   WBC 8.0  4.0 - 10.5 K/uL   RBC 3.57 (*) 3.87 - 5.11 MIL/uL   Hemoglobin 10.4 (*) 12.0 - 15.0 g/dL   HCT 34.5 (*) 36.0 - 46.0 %   MCV 96.6  78.0 - 100.0 fL   MCH 29.1  26.0 - 34.0 pg   MCHC 30.1  30.0 - 36.0 g/dL   RDW 15.5  11.5 - 15.5 %   Platelets 380  150 - 400 K/uL  COMPREHENSIVE METABOLIC PANEL      Result Value Ref Range   Sodium 141  137 - 147 mEq/L   Potassium 4.5  3.7 - 5.3 mEq/L   Chloride 102  96 - 112 mEq/L   CO2 25  19 - 32 mEq/L   Glucose, Bld 127 (*) 70 - 99 mg/dL   BUN 44 (*) 6 - 23 mg/dL   Creatinine, Ser 1.22 (*) 0.50 - 1.10 mg/dL   Calcium 9.7  8.4 - 10.5 mg/dL   Total Protein 7.3  6.0 - 8.3 g/dL   Albumin 2.2 (*) 3.5 - 5.2 g/dL   AST 13  0 - 37 U/L   ALT 7  0 - 35 U/L   Alkaline Phosphatase 96  39 - 117 U/L   Total Bilirubin 0.2 (*) 0.3 - 1.2 mg/dL   GFR calc non Af Amer 38 (*) >90 mL/min   GFR calc Af Amer 44 (*) >90 mL/min  URINALYSIS, ROUTINE W REFLEX MICROSCOPIC      Result Value Ref Range   Color, Urine YELLOW  YELLOW   APPearance CLEAR  CLEAR   Specific Gravity, Urine 1.021  1.005 - 1.030   pH 5.5  5.0 - 8.0   Glucose, UA NEGATIVE  NEGATIVE mg/dL   Hgb urine  dipstick NEGATIVE  NEGATIVE   Bilirubin Urine NEGATIVE  NEGATIVE   Ketones, ur NEGATIVE  NEGATIVE mg/dL   Protein, ur NEGATIVE  NEGATIVE mg/dL   Urobilinogen, UA 1.0  0.0 - 1.0 mg/dL   Nitrite NEGATIVE  NEGATIVE   Leukocytes, UA NEGATIVE  NEGATIVE  CBG MONITORING, ED      Result Value Ref Range   Glucose-Capillary 122 (*) 70 - 99 mg/dL   Comment 1 Orig Pt Id entered as [id number]    I-STAT TROPOININ, ED      Result Value Ref Range   Troponin i, poc 0.30 (*) 0.00 - 0.08 ng/mL   Comment NOTIFIED PHYSICIAN     Comment 3           POC OCCULT BLOOD, ED      Result Value Ref Range   Fecal Occult Bld NEGATIVE  NEGATIVE   Dg Hip Complete Left  01/04/2014   CLINICAL DATA:  Limited mobility of the left leg  EXAM: LEFT HIP - COMPLETE 2+ VIEW  COMPARISON:  CT abdomen pelvis of 03/15/2006  FINDINGS: There is only mild degenerative joint disease of the left hip with slight loss of joint space and minimal spurring. No acute abnormality is seen. The bones are osteopenic in this elderly patient. The left pelvic ramus is intact and there is degenerative change of the left SI joint noted.  IMPRESSION: Mild degenerative change.  No acute abnormality.   Electronically Signed   By: Ivar Drape M.D.   On: 01/04/2014 12:44     MDM   NSTEMI Anemia Renal insufficiency  Patient here with daughter who provides her history, generalized weakness over the past 2 weeks - no focal symptoms,  no change in EKG or ST segment elevations, troponin elevated here, also noted with elevation in BUN and Creatnine with no baseline to check against.  Also noted with anemia but is heme negative from below.  I have talked with Dr. Tamala Julian with [facility], we will admit to them for evaluation.  I have also spoken with the cardsmaster and Cardiology will consult.    Idalia Needle Joelyn Oms, [place] 01/16/14 1448

## 2014-01-16 NOTE — ED Notes (Signed)
PA at the bedside.

## 2014-01-16 NOTE — ED Notes (Signed)
Admitting MD at the bedside.  

## 2014-01-16 NOTE — ED Provider Notes (Signed)
Medical screening examination/treatment/procedure(s) were conducted as a shared visit with non-physician practitioner(s) and myself.  I personally evaluated the patient during the encounter.   EKG Interpretation None       Orlie Dakin, MD 01/16/14 2050

## 2014-01-17 DIAGNOSIS — F0391 Unspecified dementia with behavioral disturbance: Secondary | ICD-10-CM

## 2014-01-17 DIAGNOSIS — M79609 Pain in unspecified limb: Secondary | ICD-10-CM

## 2014-01-17 DIAGNOSIS — M7989 Other specified soft tissue disorders: Secondary | ICD-10-CM

## 2014-01-17 DIAGNOSIS — I119 Hypertensive heart disease without heart failure: Secondary | ICD-10-CM

## 2014-01-17 DIAGNOSIS — F03918 Unspecified dementia, unspecified severity, with other behavioral disturbance: Secondary | ICD-10-CM | POA: Diagnosis present

## 2014-01-17 DIAGNOSIS — R7989 Other specified abnormal findings of blood chemistry: Secondary | ICD-10-CM

## 2014-01-17 DIAGNOSIS — I359 Nonrheumatic aortic valve disorder, unspecified: Secondary | ICD-10-CM

## 2014-01-17 LAB — URINE CULTURE
CULTURE: NO GROWTH
Colony Count: NO GROWTH
SPECIAL REQUESTS: NORMAL

## 2014-01-17 LAB — CBC
HEMATOCRIT: 34.3 % — AB (ref 36.0–46.0)
HEMOGLOBIN: 10.5 g/dL — AB (ref 12.0–15.0)
MCH: 29.4 pg (ref 26.0–34.0)
MCHC: 30.6 g/dL (ref 30.0–36.0)
MCV: 96.1 fL (ref 78.0–100.0)
Platelets: 423 10*3/uL — ABNORMAL HIGH (ref 150–400)
RBC: 3.57 MIL/uL — ABNORMAL LOW (ref 3.87–5.11)
RDW: 15.5 % (ref 11.5–15.5)
WBC: 8.8 10*3/uL (ref 4.0–10.5)

## 2014-01-17 LAB — RETICULOCYTES
RBC.: 3.35 MIL/uL — ABNORMAL LOW (ref 3.87–5.11)
Retic Count, Absolute: 90.5 10*3/uL (ref 19.0–186.0)
Retic Ct Pct: 2.7 % (ref 0.4–3.1)

## 2014-01-17 LAB — BASIC METABOLIC PANEL
BUN: 40 mg/dL — AB (ref 6–23)
CHLORIDE: 102 meq/L (ref 96–112)
CO2: 23 meq/L (ref 19–32)
CREATININE: 1.12 mg/dL — AB (ref 0.50–1.10)
Calcium: 9.7 mg/dL (ref 8.4–10.5)
GFR calc non Af Amer: 42 mL/min — ABNORMAL LOW (ref >90)
GFR, EST AFRICAN AMERICAN: 48 mL/min — AB (ref >90)
GLUCOSE: 114 mg/dL — AB (ref 70–99)
POTASSIUM: 4.4 meq/L (ref 3.7–5.3)
Sodium: 140 mEq/L (ref 137–147)

## 2014-01-17 LAB — LEGIONELLA ANTIGEN, URINE: Legionella Antigen, Urine: NEGATIVE

## 2014-01-17 LAB — IRON AND TIBC
IRON: 17 ug/dL — AB (ref 42–135)
Saturation Ratios: 7 % — ABNORMAL LOW (ref 20–55)
TIBC: 246 ug/dL — AB (ref 250–470)
UIBC: 229 ug/dL (ref 125–400)

## 2014-01-17 LAB — STREP PNEUMONIAE URINARY ANTIGEN: STREP PNEUMO URINARY ANTIGEN: NEGATIVE

## 2014-01-17 LAB — FERRITIN: Ferritin: 165 ng/mL (ref 10–291)

## 2014-01-17 LAB — TROPONIN I: Troponin I: <0.3 ng/mL (ref <0.30)

## 2014-01-17 LAB — VITAMIN B12: Vitamin B-12: 720 pg/mL (ref 211–911)

## 2014-01-17 LAB — FOLATE: FOLATE: 8 ng/mL

## 2014-01-17 MED ORDER — HALOPERIDOL LACTATE 5 MG/ML IJ SOLN: 1.0000 mg | Freq: Once | INTRAMUSCULAR | Status: AC

## 2014-01-17 MED ORDER — HALOPERIDOL LACTATE 5 MG/ML IJ SOLN
2.0000 mg | Freq: Once | INTRAMUSCULAR | Status: AC
  Administered 2014-01-17: 2 mg via INTRAVENOUS
  Filled 2014-01-17: qty 1

## 2014-01-17 MED ORDER — HALOPERIDOL 1 MG PO TABS
1.0000 mg | ORAL_TABLET | Freq: Once | ORAL | Status: AC
  Administered 2014-01-17: 1 mg via ORAL
  Filled 2014-01-17: qty 1

## 2014-01-17 MED ORDER — FERROUS GLUCONATE 324 (38 FE) MG PO TABS
324.0000 mg | ORAL_TABLET | Freq: Two times a day (BID) | ORAL | Status: DC
  Administered 2014-01-17 – 2014-01-20 (×6): 324 mg via ORAL
  Filled 2014-01-17 (×8): qty 1

## 2014-01-17 MED ORDER — ENSURE COMPLETE PO LIQD
237.0000 mL | Freq: Two times a day (BID) | ORAL | Status: DC
  Administered 2014-01-17 – 2014-01-20 (×4): 237 mL via ORAL

## 2014-01-17 MED ORDER — METOPROLOL TARTRATE 50 MG PO TABS
50.0000 mg | ORAL_TABLET | Freq: Two times a day (BID) | ORAL | Status: DC
  Administered 2014-01-17 (×2): 50 mg via ORAL
  Filled 2014-01-17 (×4): qty 1

## 2014-01-17 NOTE — Progress Notes (Signed)
  Echocardiogram 2D Echocardiogram has been performed.  Basilia Jumbo 01/17/2014, 10:00 AM

## 2014-01-17 NOTE — Evaluation (Signed)
Clinical/Bedside Swallow Evaluation Patient Details  Name: Hannah Manning MRN: [id number] Date of Birth: Sep 18, 1921  Today's Date: 01/17/2014 Time: 1025-1045 SLP Time Calculation (min): 20 min  Past Medical History:  Past Medical History  Diagnosis Date  . Hypothyroid 1989  . Hypertension   . A-fib     For about 5 years  . Arthritis   . Cancer   . Hard of hearing    Past Surgical History:  Past Surgical History  Procedure Laterality Date  . Breast surgery    . Abdominal hysterectomy     HPI:  Pt is a 80 yo female with a pmhx significant for HTN, Afib, dementia, HOH and overall generalized weakness for several weeks; pt c/o right knee pain upon arrival to ED; pleasantly confused during admission to hospital; CXR on 01/16/14 yielded results of mild LLL airspace disease and small left effusion and possible pneumonia   Assessment / Plan / Recommendation Clinical Impression  Pt with no s/s of aspiration with any consistency tested ranging from thin-solids; oral holding intially (suspect d/t cognitive status), but with sensory cueing and encouragement, pt's swallow was timely with all other consistencies; normal oropharyngeal swallow observed during BSE; recommend continue current diet with thin liquids with general swallowing precautions; no ST f/u recommended    Aspiration Risk  None    Diet Recommendation Regular;Thin liquid   Liquid Administration via: Straw;Cup Medication Administration: Whole meds with puree Supervision: Trained caregiver to feed patient (prn) Compensations: Slow rate;Small sips/bites Postural Changes and/or Swallow Maneuvers: Seated upright 90 degrees    Other  Recommendations Oral Care Recommendations: Oral care BID   Follow Up Recommendations  None    Frequency and Duration     n/a   Pertinent Vitals/Pain WDL    SLP Swallow Goals  n/a   Swallow Study Prior Functional Status    Regular diet/thin liquids; no c/o difficulty while eating  by daughter    General Date of Onset: 01/16/14 HPI: Pt is a 80 yo female with a pmhx significant for HTN, Afib, dementia, HOH and overall generalized weakness for several weeks; pt c/o right knee pain upon arrival to ED; pleasantly confused during admission to hospital; CXR on 01/16/14 yielded results of mild LLL airspace disease and small left effusion and possible pneumonia Type of Study: Bedside swallow evaluation Diet Prior to this Study: Regular;Thin liquids Temperature Spikes Noted: No Respiratory Status: Nasal cannula History of Recent Intubation: No Behavior/Cognition: Cooperative;Pleasant mood;Confused;Hard of hearing Oral Cavity - Dentition: Missing dentition Self-Feeding Abilities: Total assist Patient Positioning: Upright in bed Baseline Vocal Quality: Clear;Low vocal intensity Volitional Cough: Cognitively unable to elicit Volitional Swallow: Unable to elicit    Oral/Motor/Sensory Function Overall Oral Motor/Sensory Function: Appears within functional limits for tasks assessed Labial ROM: Within Functional Limits Labial Symmetry: Within Functional Limits Labial Strength: Within Functional Limits Labial Sensation: Within Functional Limits Lingual ROM: Within Functional Limits Lingual Symmetry: Within Functional Limits Lingual Strength: Other (Comment) Lingual Sensation: Other (Comment) Facial ROM: Other (Comment) (limited OME d/t cognitive status/HOH) Facial Symmetry: Within Functional Limits Facial Strength: Within Functional Limits Facial Sensation: Within Functional Limits Velum:  (u/a to fully assess d/t cognition; appears wfl) Mandible: Within Functional Limits   Ice Chips Ice chips: Within functional limits Presentation: Spoon   Thin Liquid Thin Liquid: Within functional limits Presentation: Cup;Straw    Nectar Thick Nectar Thick Liquid: Not tested   Honey Thick Honey Thick Liquid: Not tested   Puree Puree: Within functional limits Presentation: Spoon  Solid       Solid: Within functional limits Presentation:  (Fed by SLP d/t restraints)       Drue Novel, M.S., CCC-SLP 01/17/2014,12:06 PM

## 2014-01-17 NOTE — Progress Notes (Addendum)
TELEMETRY: Reviewed telemetry pt in atrial fibrillation rate 90-105.: Filed Vitals:   01/16/14 1645 01/16/14 1715 01/16/14 2058 01/17/14 0350  BP: 126/75 129/69 108/57 149/88  Pulse: 45 107 82 109  Temp:   98.3 F (36.8 C) 98.2 F (36.8 C)  TempSrc:   Axillary Axillary  Resp: 28 25  20   Height:      Weight:    152 lb 9.6 oz (69.219 kg)  SpO2: 96% 95% 94% 92%    Intake/Output Summary (Last 24 hours) at 01/17/14 1119 Last data filed at 01/17/14 0816  Gross per 24 hour  Intake    520 ml  Output    300 ml  Net    220 ml    SUBJECTIVE Very confused. Not coherent.  LABS: Basic Metabolic Panel:  Recent Labs  01/16/14 1202 01/17/14 0715  NA 141 140  K 4.5 4.4  CL 102 102  CO2 25 23  GLUCOSE 127* 114*  BUN 44* 40*  CREATININE 1.22* 1.12*  CALCIUM 9.7 9.7   Liver Function Tests:  Recent Labs  01/16/14 1202  AST 13  ALT 7  ALKPHOS 96  BILITOT 0.2*  PROT 7.3  ALBUMIN 2.2*   No results found for this basename: LIPASE, AMYLASE,  in the last 72 hours CBC:  Recent Labs  01/16/14 1202 01/17/14 0715  WBC 8.0 8.8  HGB 10.4* 10.5*  HCT 34.5* 34.3*  MCV 96.6 96.1  PLT 380 423*   Cardiac Enzymes:  Recent Labs  01/16/14 1558 01/16/14 2200 01/17/14 0406  CKTOTAL 39  --   --   CKMB 1.9  --   --   TROPONINI <0.30 <0.30 <0.30   Dig level: 1.4  Anemia Panel:  Recent Labs  01/17/14 0406  VITAMINB12 720  FOLATE 8.0  FERRITIN 165  TIBC 246*  IRON 17*  RETICCTPCT 2.7    Radiology/Studies:  Dg Hip Complete Left  01/04/2014   CLINICAL DATA:  Limited mobility of the left leg  EXAM: LEFT HIP - COMPLETE 2+ VIEW  COMPARISON:  CT abdomen pelvis of 03/15/2006  FINDINGS: There is only mild degenerative joint disease of the left hip with slight loss of joint space and minimal spurring. No acute abnormality is seen. The bones are osteopenic in this elderly patient. The left pelvic ramus is intact and there is degenerative change of the left SI joint noted.   IMPRESSION: Mild degenerative change.  No acute abnormality.   Electronically Signed   By: Ivar Drape M.D.   On: 01/04/2014 12:44   Dg Chest Port 1 View  01/16/2014   CLINICAL DATA:  Weakness and hypertension  EXAM: PORTABLE CHEST - 1 VIEW  COMPARISON:  03/05/2005  FINDINGS: Cardiac enlargement.  Negative for heart failure  Mild left lower lobe airspace disease and small left effusion. Possible pneumonia.  Surgical clips in the left axilla and left breast.  IMPRESSION: Left lower lobe airspace disease and small left effusion, question pneumonia   Electronically Signed   By: Franchot Gallo M.D.   On: 01/16/2014 15:27   Dg Knee Complete 4 Views Right  01/16/2014   CLINICAL DATA:  Right knee pain and swelling  EXAM: RIGHT KNEE - COMPLETE 4+ VIEW  COMPARISON:  DG KNEE 2 VIEWS*R* dated 03/26/2010  FINDINGS: Moderate narrowing of the medial compartment and severe narrowing of the lateral compartment with mild medial and moderate to severe lateral osteophyte formation. Moderate narrowing of the patellofemoral compartment with moderate osteophyte formation. Moderate joint effusion  present. No fracture or dislocation. Extensive below the knee and above the knee vascular calcification present.  IMPRESSION: Moderate to severe arthritis, mildly worse in the medial and patellofemoral compartments when compared to the prior study.   Electronically Signed   By: Skipper Cliche M.D.   On: 01/16/2014 16:33   Ecg: afib, LAFB, poor R wave progression  Echo:Study Conclusions  - Left ventricle: Small cavity with severe LVH. Decreased thickening of inferior septum. EF is in the 45% range. Findings consistent with left ventricular diastolic dysfunction. - Aortic valve: Moderately severe calcification with mild aortic stenosis. Mean gradient: 25mm Hg (S). Peak gradient: 85mm Hg (S). - Left atrium: The atrium was moderately to severely dilated. - Right ventricle: The cavity size was mildly dilated. Systolic function  was moderately reduced. - Right atrium: The atrium was mildly dilated. - Pulmonary arteries: PA peak pressure: 83mm Hg (S).   PHYSICAL EXAM General: Elderly, thin BF. Confused. Head: Normal. Hard of hearing. Missing dentition. Neck: Negative for carotid bruits. JVD not elevated. Lungs: Clear bilaterally to auscultation without wheezes, rales, or rhonchi. Breathing is unlabored. Heart: IRRR S1 S2 without murmurs, rubs, or gallops.  Abdomen: Soft, non-tender, non-distended with normoactive bowel sounds. No hepatomegaly. No rebound/guarding. No obvious abdominal masses. Extremities: No clubbing, cyanosis or edema.   Neuro: sedated and confused.  ASSESSMENT AND PLAN: 1. Elevated troponin. Only POC troponin elevated. All other troponins are normal suggesting POC was spurious. No clinical symptoms of ischemia. Continue beta blocker. No further cardiac work up planned.  2. Afib. Rate fairly well controlled. Patient at high risk of Dig toxicity due to age and renal dysfunction. Will DC Dig and increase Toprol for rate control. Pradaxa on hold due to anemia. 3. HTN with hypertensive heart disease. Severe LVH by Echo with mildly decreased LV function.  Focus on BP control.  4. Anemia. 5. CKD stage 2-3  Principal Problem:   Weakness Active Problems:   Troponin I above reference range - on POC   Hard of hearing   Anemia   Renal insufficiency   A-fib   CAP (community acquired pneumonia)   Dementia with behavioral disturbance    Weston Brass Keylor Rands M [place] MD,FACC 01/17/2014 11:30 AM

## 2014-01-17 NOTE — Progress Notes (Signed)
*  PRELIMINARY RESULTS* Vascular Ultrasound Lower extremity venous duplex has been completed.  Preliminary findings: Right = no evidence of DVT.   Landry Mellow, RDMS, RVT  01/17/2014, 9:41 AM

## 2014-01-17 NOTE — Progress Notes (Signed)
Pt confused and thinking she is at home and we have broken into her house. Pt wont leave leads on and yelling and hitting at staff. Message sent to K. Schorr, NP with orders received. Pt refusing to take PO's so given some haldol IV. Will cont to monitor.

## 2014-01-17 NOTE — Progress Notes (Signed)
Called to room by nurse tech. Patient had managed to get her hands on her IV and pull it out. Pressure held by this nurse until bleeding stopped. Very agitated this a.m. Will call Dr.Rai to obtain an order for agitation.

## 2014-01-17 NOTE — Progress Notes (Signed)
Patient ID: Hannah Manning  female  GUR:[id number]    DOB: 1921/07/07    DOA: 01/16/2014  PCP: Hannah Jacobson, MD  Assessment/Plan: Principal Problem:  generalized Weakness with community acquired pneumonia - Continue IV Rocephin and Zithromax - Follow blood cultures, urine strep antigen negative, no UTI  Active Problems: Atrial fibrillation with elevated troponin - POC troponin was elevated other than that troponins x3 negative - Cardiology was consulted and recommended no further cardiac workup. Continue beta blocker - For atrial fibrillation, patient at high risk for digoxin toxicity due to degenerated dysfunction, Toprol-XL was increased for rate control and digoxin was discontinued. - Pradaxa placed on hold due to anemia - 2-D echo showed EF of 45 range, decreased thickening of the inferior septum, left ventricle diastolic dysfunction, moderately severe aortic calcification, moderate to severe left atrium dilation, right ventricle mildly dilated,RVSF systolic function moderately reduced  Acute encephalopathy with underlying dementia, behavioral issues - Patient agitated and combative this morning, placed on Haldol     Anemia - Hemoglobin 10.5, stool occult test negative, has iron deficiency, place on iron replacement   DVT Prophylaxis: SCD's  Code Status: full Code   Family Communication:Discussed with daughter   Disposition:  Consultants:  Cardiology   Procedures:  2-D echo   Antibiotics:  IV Rocephin  IV Zithromax   Subjective: Patient alert and awake, hard of hearing, agitated  Objective: Weight change:   Intake/Output Summary (Last 24 hours) at 01/17/14 1155 Last data filed at 01/17/14 0816  Gross per 24 hour  Intake    520 ml  Output    300 ml  Net    220 ml   Blood pressure 149/88, pulse 109, temperature 98.2 F (36.8 C), temperature source Axillary, resp. rate 20, height 5\' 4"  (1.626 m), weight 69.219 kg (152 lb 9.6 oz), SpO2  92.00%.  Physical Exam: General: Alert and awake,  agitated  CVS: S1-S2 clear, no murmur rubs or gallops Chest: clear to auscultation bilaterally, no wheezing, rales or rhonchi Abdomen: soft nontender, nondistended, normal bowel sounds  Extremities: no cyanosis, clubbing or edema noted bilaterally   Lab Results: Basic Metabolic Panel:  Recent Labs Lab 01/16/14 1202 01/17/14 0715  NA 141 140  K 4.5 4.4  CL 102 102  CO2 25 23  GLUCOSE 127* 114*  BUN 44* 40*  CREATININE 1.22* 1.12*  CALCIUM 9.7 9.7   Liver Function Tests:  Recent Labs Lab 01/16/14 1202  AST 13  ALT 7  ALKPHOS 96  BILITOT 0.2*  PROT 7.3  ALBUMIN 2.2*   No results found for this basename: LIPASE, AMYLASE,  in the last 168 hours No results found for this basename: AMMONIA,  in the last 168 hours CBC:  Recent Labs Lab 01/16/14 1202 01/17/14 0715  WBC 8.0 8.8  HGB 10.4* 10.5*  HCT 34.5* 34.3*  MCV 96.6 96.1  PLT 380 423*   Cardiac Enzymes:  Recent Labs Lab 01/16/14 1558 01/16/14 2200 01/17/14 0406  CKTOTAL 39  --   --   CKMB 1.9  --   --   TROPONINI <0.30 <0.30 <0.30   BNP: No components found with this basename: POCBNP,  CBG:  Recent Labs Lab 01/16/14 1203  GLUCAP 122*     Micro Results: Recent Results (from the past 240 hour(s))  URINE CULTURE     Status: None   Collection Time    01/16/14  1:01 PM      Result Value Ref Range Status  Specimen Description URINE, RANDOM   Final   Special Requests Normal   Final   Culture  Setup Time     Final   Value: 01/16/2014 13:01     Performed at [facility]     Final   Value: NO GROWTH     Performed at [facility]   Culture     Final   Value: NO GROWTH     Performed at [facility]   Report Status 01/17/2014 FINAL   Final    Studies/Results: Dg Hip Complete Left  01/04/2014   CLINICAL DATA:  Limited mobility of the left leg  EXAM: LEFT HIP - COMPLETE 2+ VIEW  COMPARISON:  CT abdomen  pelvis of 03/15/2006  FINDINGS: There is only mild degenerative joint disease of the left hip with slight loss of joint space and minimal spurring. No acute abnormality is seen. The bones are osteopenic in this elderly patient. The left pelvic ramus is intact and there is degenerative change of the left SI joint noted.  IMPRESSION: Mild degenerative change.  No acute abnormality.   Electronically Signed   By: Ivar Drape M.D.   On: 01/04/2014 12:44   Dg Chest Port 1 View  01/16/2014   CLINICAL DATA:  Weakness and hypertension  EXAM: PORTABLE CHEST - 1 VIEW  COMPARISON:  03/05/2005  FINDINGS: Cardiac enlargement.  Negative for heart failure  Mild left lower lobe airspace disease and small left effusion. Possible pneumonia.  Surgical clips in the left axilla and left breast.  IMPRESSION: Left lower lobe airspace disease and small left effusion, question pneumonia   Electronically Signed   By: Franchot Gallo M.D.   On: 01/16/2014 15:27   Dg Knee Complete 4 Views Right  01/16/2014   CLINICAL DATA:  Right knee pain and swelling  EXAM: RIGHT KNEE - COMPLETE 4+ VIEW  COMPARISON:  DG KNEE 2 VIEWS*R* dated 03/26/2010  FINDINGS: Moderate narrowing of the medial compartment and severe narrowing of the lateral compartment with mild medial and moderate to severe lateral osteophyte formation. Moderate narrowing of the patellofemoral compartment with moderate osteophyte formation. Moderate joint effusion present. No fracture or dislocation. Extensive below the knee and above the knee vascular calcification present.  IMPRESSION: Moderate to severe arthritis, mildly worse in the medial and patellofemoral compartments when compared to the prior study.   Electronically Signed   By: Skipper Cliche M.D.   On: 01/16/2014 16:33    Medications: Scheduled Meds: . azithromycin  500 mg Intravenous Q24H  . cefTRIAXone (ROCEPHIN)  IV  1 g Intravenous Q24H  . feeding supplement (ENSURE COMPLETE)  237 mL Oral BID BM  . levothyroxine   175 mcg Oral QAC breakfast  . metoprolol tartrate  50 mg Oral BID  . simvastatin  20 mg Oral q1800      LOS: 1 day   Hannah Manning M.D. [facility] 01/17/2014, 11:55 AM Pager: [phone]  If 7PM-7AM, please contact night-coverage [email or web address] Password [email or web address]  **Disclaimer: This note was dictated with voice recognition software. Similar sounding words can inadvertently be transcribed and this note may contain transcription errors which may not have been corrected upon publication of note.**

## 2014-01-17 NOTE — Progress Notes (Signed)
UR Completed Paisely Brick Graves-Bigelow, RN,BSN [phone]  

## 2014-01-17 NOTE — Progress Notes (Signed)
INITIAL NUTRITION ASSESSMENT  DOCUMENTATION CODES Per approved criteria  -Not Applicable   INTERVENTION:  Ensure Complete PO BID, each supplement provides 350 kcal and 13 grams of protein  NUTRITION DIAGNOSIS: Inadequate oral intake related to altered mental status and recent weakness as evidenced by 50% meal completion.   Goal: Intake to meet >90% of estimated nutrition needs.  Monitor:  PO intake, labs, weight trend.  Reason for Assessment: Low [place]aden  80 y.o. female  Admitting Dx: Weakness  ASSESSMENT: Patient is a 80 y.o. female with prior h/o hypertension, afib on pradaxa, was brought in for generalized weakness for several weeks. On arrival to ED, she was found to be comfortable. Her only complaint was right knee pain. She denies any chest pain or sob. Her troponin was elevated. Cardiology consulted. She was referred to medical service for further evaluation.   Vascular ultrasound showed no evidence of DVT in right leg. Patient has been very agitated this morning, unable to complete nutrition focused physical exam at this time.  Height: Ht Readings from Last 1 Encounters:  01/16/14 5\' 4"  (1.626 m)    Weight: Wt Readings from Last 1 Encounters:  01/17/14 152 lb 9.6 oz (69.219 kg)    Ideal Body Weight: 54.5 kg  % Ideal Body Weight: 127%  Wt Readings from Last 10 Encounters:  01/17/14 152 lb 9.6 oz (69.219 kg)    Usual Body Weight: unknown  % Usual Body Weight: NA  BMI:  Body mass index is 26.18 kg/(m^2).  Estimated Nutritional Needs: Kcal: 1400-1600 Protein: 75-85 gm Fluid: 1.4-1.6 L  Skin: no wounds  Diet Order: Cardiac  Patient consumed 50% of breakfast this morning.  EDUCATION NEEDS: -Education not appropriate at this time   Intake/Output Summary (Last 24 hours) at 01/17/14 1048 Last data filed at 01/17/14 0816  Gross per 24 hour  Intake    520 ml  Output    300 ml  Net    220 ml    Last BM: None documented since  admission  Labs:   Recent Labs Lab 01/16/14 1202 01/17/14 0715  NA 141 140  K 4.5 4.4  CL 102 102  CO2 25 23  BUN 44* 40*  CREATININE 1.22* 1.12*  CALCIUM 9.7 9.7  GLUCOSE 127* 114*    CBG (last 3)   Recent Labs  01/16/14 1203  GLUCAP 122*    Scheduled Meds: . azithromycin  500 mg Intravenous Q24H  . cefTRIAXone (ROCEPHIN)  IV  1 g Intravenous Q24H  . digoxin  0.0625 mg Oral Daily  . levothyroxine  175 mcg Oral QAC breakfast  . metoprolol succinate  50 mg Oral Daily  . simvastatin  20 mg Oral q1800    Continuous Infusions:   Past Medical History  Diagnosis Date  . Hypothyroid 1989  . Hypertension   . A-fib     For about 5 years  . Arthritis   . Cancer   . Hard of hearing     Past Surgical History  Procedure Laterality Date  . Breast surgery    . Abdominal hysterectomy      Molli Barrows, [place], LDN, [place] Pager [phone] After Hours Pager [phone]

## 2014-01-17 NOTE — Evaluation (Signed)
Physical Therapy Evaluation Patient Details Name: Hannah Manning MRN: [id number] DOB: [birth year] Today's Date: 01/17/2014   History of Present Illness  Pt is a 80 yo female with a pmhx significant for HTN, Afib, dementia, HOH and overall generalized weakness for several weeks; pt c/o right knee pain upon arrival to ED; pleasantly confused during admission to hospital; CXR on 01/16/14 yielded results of mild LLL airspace disease and small left effusion and possible pneumonia   Clinical Impression  Pt not following any commands on eval so physically guided through eval. No family present on evaluation. Pt will require 24 hr supervision upon d/c but this appears to be baseline so at this point recommending HHPT with 24 hr assist. If this was not her prior situation and 24 hr unavailable, rec SNF. Pt transferred bed to chair with +2 tot A. PT will continue to follow to increase safety with mobilty and decrease caregiver burden.    Follow Up Recommendations Home health PT;Supervision/Assistance - 24 hour    Equipment Recommendations  None recommended by PT    Recommendations for Other Services OT consult     Precautions / Restrictions Precautions Precautions: Fall Restrictions Weight Bearing Restrictions: No      Mobility  Bed Mobility Overal bed mobility: Needs Assistance Bed Mobility: Supine to Sit     Supine to sit: Mod assist     General bed mobility comments: pt did not follow commands to sit up so mvmt facilitated by therapist. Heavy right lean at first, requiring mod A but once feet on floor and pt sat for ~2 mins, able to maintain static balance with min-guard.   Transfers Overall transfer level: Needs assistance Equipment used: 2 person hand held assist Transfers: Sit to/from [facility] Sit to Stand: +2 physical assistance Stand pivot transfers: +2 physical assistance       General transfer comment: pt very fearful of falling, bilateral feet and  knees had to be blocked as pt maintaining trunk flexion and posterior lean with feet sliding fwd.  Manual facilitation at hips to guide from bed to chair as pt was resisting motion. Knees remained flexed throughout transfer   Ambulation/Gait             General Gait Details: unable  Stairs            Wheelchair Mobility    Modified Rankin (Stroke Patients Only)       Balance Overall balance assessment: Needs assistance Sitting-balance support: No upper extremity supported;Feet supported Sitting balance-Leahy Scale: Fair Sitting balance - Comments: right posterior lean with initial sitting but then gained balance, but can only maintain with static sitting, when she begins to perform dynamic motion, posterior LOB Postural control: Right lateral lean;Posterior lean Standing balance support: Bilateral upper extremity supported;During functional activity Standing balance-Leahy Scale: Zero Standing balance comment: cannot stand without +2 assist                             Pertinent Vitals/Pain Grimace with movement of right knee, crepitus noted.     Home Living Family/patient expects to be discharged to:: Private residence Living Arrangements: Children;Other (Comment) (caregiver) Available Help at Discharge: Family;Personal care attendant             Additional Comments: no family present on eval and pt unable to give any history, RN relayed that pt lives with daughter and chart mentioned that she has caregives    Prior Function  Level of Independence: Needs assistance   Gait / Transfers Assistance Needed: may be nonambulatory or could just be due to right knee pain today           Hand Dominance        Extremity/Trunk Assessment   Upper Extremity Assessment: Generalized weakness           Lower Extremity Assessment: Generalized weakness;RLE deficits/detail;LLE deficits/detail;Difficult to assess due to impaired cognition RLE Deficits /  Details: crepitus noted in knee with mvmt and flexion limited to 80 degrees before very uncomfortable, generalized weakness noted with sit to stand but could not formally assess pt as she was not following any commands LLE Deficits / Details: left leg does not appear painful but still slightly limited in knee flexion and generalized weakness noted as well.  Cervical / Trunk Assessment: Kyphotic  Communication   Communication: HOH;Expressive difficulties;Receptive difficulties  Cognition Arousal/Alertness: Awake/alert Behavior During Therapy: Restless Overall Cognitive Status: No family/caregiver present to determine baseline cognitive functioning Area of Impairment: Orientation;Memory;Attention;Following commands;Safety/judgement;Problem solving Orientation Level: Disoriented to;Place;Time;Situation Current Attention Level: Focused Memory: Decreased short-term memory Following Commands: Follows one step commands inconsistently Safety/Judgement: Decreased awareness of safety;Decreased awareness of deficits   Problem Solving: Decreased initiation;Difficulty sequencing;Requires verbal cues;Requires tactile cues General Comments: pt talking nonsensical though not combative during eval, just received Haldol, sitter present    General Comments      Exercises Other Exercises Other Exercises: attempted LE exercises but pt not following any commands and becoming lethargic from Haldol so halted       Assessment/Plan    PT Assessment Patient needs continued PT services  PT Diagnosis Generalized weakness;Acute pain;Altered mental status   PT Problem List Decreased strength;Decreased range of motion;Decreased activity tolerance;Decreased balance;Decreased mobility;Decreased coordination;Decreased cognition;Decreased knowledge of use of DME;Decreased safety awareness;Decreased knowledge of precautions;Pain  PT Treatment Interventions DME instruction;Functional mobility training;Therapeutic  activities;Therapeutic exercise;Balance training;Neuromuscular re-education;Cognitive remediation;Patient/family education   PT Goals (Current goals can be found in the Care Plan section) Acute Rehab PT Goals Patient Stated Goal: none stated PT Goal Formulation: Patient unable to participate in goal setting Time For Goal Achievement: 01/31/14 Potential to Achieve Goals: Fair    Frequency Min 3X/week   Barriers to discharge Other (comment) unsure of home environment due to family not present    Co-evaluation               End of Session Equipment Utilized During Treatment: Gait belt;Oxygen Activity Tolerance: Patient limited by lethargy;Other (comment) (AMS) Patient left: in chair;with call bell/phone within reach;with nursing/sitter in room Nurse Communication: Mobility status         Time: 1100-1119 PT Time Calculation (min): 19 min   Charges:   PT Evaluation $Initial PT Evaluation Tier I: 1 Procedure PT Treatments $Therapeutic Activity: 8-22 mins   PT G Codes:        Leighton Roach, PT  Acute Rehab Services  [place] 01/17/2014, 12:54 PM

## 2014-01-18 ENCOUNTER — Encounter (HOSPITAL_COMMUNITY): Payer: Self-pay | Admitting: General Practice

## 2014-01-18 DIAGNOSIS — J189 Pneumonia, unspecified organism: Principal | ICD-10-CM

## 2014-01-18 DIAGNOSIS — N183 Chronic kidney disease, stage 3 unspecified: Secondary | ICD-10-CM

## 2014-01-18 LAB — SEDIMENTATION RATE: Sed Rate: 82 mm/hr — ABNORMAL HIGH (ref 0–22)

## 2014-01-18 LAB — BASIC METABOLIC PANEL
BUN: 42 mg/dL — ABNORMAL HIGH (ref 6–23)
CALCIUM: 9.4 mg/dL (ref 8.4–10.5)
CO2: 25 meq/L (ref 19–32)
CREATININE: 1.18 mg/dL — AB (ref 0.50–1.10)
Chloride: 100 mEq/L (ref 96–112)
GFR, EST AFRICAN AMERICAN: 45 mL/min — AB (ref >90)
GFR, EST NON AFRICAN AMERICAN: 39 mL/min — AB (ref >90)
Glucose, Bld: 97 mg/dL (ref 70–99)
Potassium: 4.7 mEq/L (ref 3.7–5.3)
SODIUM: 137 meq/L (ref 137–147)

## 2014-01-18 LAB — T3: T3 TOTAL: 35.2 ng/dL — AB (ref 80.0–204.0)

## 2014-01-18 LAB — CBC
HCT: 32.8 % — ABNORMAL LOW (ref 36.0–46.0)
Hemoglobin: 9.8 g/dL — ABNORMAL LOW (ref 12.0–15.0)
MCH: 28.9 pg (ref 26.0–34.0)
MCHC: 29.9 g/dL — AB (ref 30.0–36.0)
MCV: 96.8 fL (ref 78.0–100.0)
PLATELETS: 365 10*3/uL (ref 150–400)
RBC: 3.39 MIL/uL — ABNORMAL LOW (ref 3.87–5.11)
RDW: 15.8 % — ABNORMAL HIGH (ref 11.5–15.5)
WBC: 7.7 10*3/uL (ref 4.0–10.5)

## 2014-01-18 LAB — FOLATE: Folate: 7.2 ng/mL

## 2014-01-18 LAB — TSH: TSH: 7.36 u[IU]/mL — AB (ref 0.350–4.500)

## 2014-01-18 LAB — URIC ACID: URIC ACID, SERUM: 6.4 mg/dL (ref 2.4–7.0)

## 2014-01-18 LAB — C-REACTIVE PROTEIN: CRP: 5.5 mg/dL — ABNORMAL HIGH (ref <0.60)

## 2014-01-18 LAB — VITAMIN B12: Vitamin B-12: 570 pg/mL (ref 211–911)

## 2014-01-18 LAB — T4, FREE: Free T4: 1.15 ng/dL (ref 0.80–1.80)

## 2014-01-18 MED ORDER — METOPROLOL TARTRATE 25 MG PO TABS
37.5000 mg | ORAL_TABLET | Freq: Two times a day (BID) | ORAL | Status: DC
  Administered 2014-01-18 – 2014-01-20 (×4): 37.5 mg via ORAL
  Filled 2014-01-18 (×5): qty 1

## 2014-01-18 MED ORDER — AZITHROMYCIN 500 MG PO TABS
500.0000 mg | ORAL_TABLET | ORAL | Status: DC
  Administered 2014-01-18 – 2014-01-19 (×2): 500 mg via ORAL
  Filled 2014-01-18 (×4): qty 1

## 2014-01-18 MED ORDER — METOPROLOL TARTRATE 25 MG PO TABS
37.5000 mg | ORAL_TABLET | Freq: Two times a day (BID) | ORAL | Status: DC
  Filled 2014-01-18 (×2): qty 1

## 2014-01-18 NOTE — Progress Notes (Addendum)
Patient ID: Hannah Manning  female  QJF:[id number]    DOB: October 25, 1920    DOA: 01/16/2014  PCP: Myriam Jacobson, MD  Assessment/Plan: Principal Problem:  generalized Weakness with community acquired pneumonia - Continue Rocephin and Zithromax - Follow blood cultures, -  urine strep antigen negative, no UTI  Active Problems: Atrial fibrillation with elevated troponin - POC troponin was elevated other than that troponins x3 negative - Cardiology was consulted and recommended no further cardiac workup. Continue beta blocker - For atrial fibrillation, patient at high risk for digoxin toxicity due to degenerated dysfunction, Toprol-XL was increased yesterday however heart rate slower today, hence lowered beta blocker dose.  - Pradaxa placed on hold due to anemia, will restart after orthopedics eval of her knee, ok by cardiology - 2-D echo showed EF of 45 range, decreased thickening of the inferior septum, left ventricle diastolic dysfunction, moderately severe aortic calcification, moderate to severe left atrium dilation, right ventricle mildly dilated,RVSF systolic function moderately reduced  Right knee pain - X-ray shows moderate to severe arthritis, mildly worsened the medial and patellofemoral compartments, PT evaluation was done right knee pain was somewhat limiting the physical therapy, we'll have orthopedics evaluation for possible ESI injection.   Acute encephalopathy with underlying dementia, behavioral issues -  Currently pleasant today, no acute issues     Anemia - Hemoglobin 9.8 stool occult test negative, has iron deficiency, place on iron replacement   DVT Prophylaxis: SCD's  Code Status: full Code   Family Communication: Called patient's daughter's number and left a detailed voicemail message, unable to make contact  Disposition:  Consultants:  Cardiology  ortho   Procedures:  2-D echo   Antibiotics:  IV Rocephin  IV Zithromax    Subjective: Patient alert and awake, much pleasant and cooperative today, no acute issues overnight. Sitter at the bedside at the time of my encounter.   Objective: Weight change:   Intake/Output Summary (Last 24 hours) at 01/18/14 1331 Last data filed at 01/18/14 0843  Gross per 24 hour  Intake    240 ml  Output      0 ml  Net    240 ml   Blood pressure 91/62, pulse 64, temperature 98.3 F (36.8 C), temperature source Oral, resp. rate 18, height 5\' 4"  (1.626 m), weight 69.219 kg (152 lb 9.6 oz), SpO2 98.00%.  Physical Exam: General: Alert and awake, pleasant CVS: S1-S2 clear, no murmur rubs or gallops Chest: clear to auscultation bilaterally Abdomen: soft nontender, nondistended, normal bowel sounds  Extremities: no cyanosis, clubbing or edema noted bilaterally, right knee swollen   Lab Results: Basic Metabolic Panel:  Recent Labs Lab 01/17/14 0715 01/18/14 0519  NA 140 137  K 4.4 4.7  CL 102 100  CO2 23 25  GLUCOSE 114* 97  BUN 40* 42*  CREATININE 1.12* 1.18*  CALCIUM 9.7 9.4   Liver Function Tests:  Recent Labs Lab 01/16/14 1202  AST 13  ALT 7  ALKPHOS 96  BILITOT 0.2*  PROT 7.3  ALBUMIN 2.2*   No results found for this basename: LIPASE, AMYLASE,  in the last 168 hours No results found for this basename: AMMONIA,  in the last 168 hours CBC:  Recent Labs Lab 01/17/14 0715 01/18/14 0519  WBC 8.8 7.7  HGB 10.5* 9.8*  HCT 34.3* 32.8*  MCV 96.1 96.8  PLT 423* 365   Cardiac Enzymes:  Recent Labs Lab 01/16/14 1558 01/16/14 2200 01/17/14 0406  CKTOTAL 39  --   --  CKMB 1.9  --   --   TROPONINI <0.30 <0.30 <0.30   BNP: No components found with this basename: POCBNP,  CBG:  Recent Labs Lab 01/16/14 1203  GLUCAP 122*     Micro Results: Recent Results (from the past 240 hour(s))  URINE CULTURE     Status: None   Collection Time    01/16/14  1:01 PM      Result Value Ref Range Status   Specimen Description URINE, RANDOM   Final    Special Requests Normal   Final   Culture  Setup Time     Final   Value: 01/16/2014 13:01     Performed at [place]     Final   Value: NO GROWTH     Performed at [facility]   Culture     Final   Value: NO GROWTH     Performed at [facility]   Report Status 01/17/2014 FINAL   Final  CULTURE, BLOOD (ROUTINE X 2)     Status: None   Collection Time    01/16/14  7:30 PM      Result Value Ref Range Status   Specimen Description BLOOD RIGHT ARM   Final   Special Requests BOTTLES DRAWN AEROBIC ONLY 5CC   Final   Culture  Setup Time     Final   Value: 01/17/2014 00:15     Performed at [facility]   Culture     Final   Value:        BLOOD CULTURE RECEIVED NO GROWTH TO DATE CULTURE WILL BE HELD FOR 5 DAYS BEFORE ISSUING A FINAL NEGATIVE REPORT     Performed at [facility]   Report Status PENDING   Incomplete  CULTURE, BLOOD (ROUTINE X 2)     Status: None   Collection Time    01/16/14  7:40 PM      Result Value Ref Range Status   Specimen Description BLOOD RIGHT ARM   Final   Special Requests BOTTLES DRAWN AEROBIC ONLY 5CC   Final   Culture  Setup Time     Final   Value: 01/17/2014 00:15     Performed at [facility]   Culture     Final   Value:        BLOOD CULTURE RECEIVED NO GROWTH TO DATE CULTURE WILL BE HELD FOR 5 DAYS BEFORE ISSUING A FINAL NEGATIVE REPORT     Performed at [facility]   Report Status PENDING   Incomplete    Studies/Results: Dg Hip Complete Left  01/04/2014   CLINICAL DATA:  Limited mobility of the left leg  EXAM: LEFT HIP - COMPLETE 2+ VIEW  COMPARISON:  CT abdomen pelvis of 03/15/2006  FINDINGS: There is only mild degenerative joint disease of the left hip with slight loss of joint space and minimal spurring. No acute abnormality is seen. The bones are osteopenic in this elderly patient. The left pelvic ramus is intact and there is degenerative change of the left SI joint noted.   IMPRESSION: Mild degenerative change.  No acute abnormality.   Electronically Signed   By: Ivar Drape M.D.   On: 01/04/2014 12:44   Dg Chest Port 1 View  01/16/2014   CLINICAL DATA:  Weakness and hypertension  EXAM: PORTABLE CHEST - 1 VIEW  COMPARISON:  03/05/2005  FINDINGS: Cardiac enlargement.  Negative for heart failure  Mild left lower  lobe airspace disease and small left effusion. Possible pneumonia.  Surgical clips in the left axilla and left breast.  IMPRESSION: Left lower lobe airspace disease and small left effusion, question pneumonia   Electronically Signed   By: Franchot Gallo M.D.   On: 01/16/2014 15:27   Dg Knee Complete 4 Views Right  01/16/2014   CLINICAL DATA:  Right knee pain and swelling  EXAM: RIGHT KNEE - COMPLETE 4+ VIEW  COMPARISON:  DG KNEE 2 VIEWS*R* dated 03/26/2010  FINDINGS: Moderate narrowing of the medial compartment and severe narrowing of the lateral compartment with mild medial and moderate to severe lateral osteophyte formation. Moderate narrowing of the patellofemoral compartment with moderate osteophyte formation. Moderate joint effusion present. No fracture or dislocation. Extensive below the knee and above the knee vascular calcification present.  IMPRESSION: Moderate to severe arthritis, mildly worse in the medial and patellofemoral compartments when compared to the prior study.   Electronically Signed   By: Skipper Cliche M.D.   On: 01/16/2014 16:33    Medications: Scheduled Meds: . azithromycin  500 mg Oral Q24H  . cefTRIAXone (ROCEPHIN)  IV  1 g Intravenous Q24H  . feeding supplement (ENSURE COMPLETE)  237 mL Oral BID BM  . ferrous gluconate  324 mg Oral BID WC  . levothyroxine  175 mcg Oral QAC breakfast  . metoprolol tartrate  37.5 mg Oral BID  . simvastatin  20 mg Oral q1800      LOS: 2 days   Ripudeep Krystal Eaton M.D. [facility] 01/18/2014, 1:31 PM Pager: [phone]  If 7PM-7AM, please contact night-coverage [email or web address] Password  [email or web address]  **Disclaimer: This note was dictated with voice recognition software. Similar sounding words can inadvertently be transcribed and this note may contain transcription errors which may not have been corrected upon publication of note.**

## 2014-01-18 NOTE — Progress Notes (Signed)
PHARMACIST - PHYSICIAN COMMUNICATION DR:   Tana Coast CONCERNING: Antibiotic IV to Oral Route Change Policy  RECOMMENDATION: This patient is receiving azithromycin by the intravenous route.  Based on criteria approved by the Pharmacy and Therapeutics Committee, the antibiotic(s) is/are being converted to the equivalent oral dose form(s).   DESCRIPTION: These criteria include:  Patient being treated for a respiratory tract infection, urinary tract infection, cellulitis or clostridium difficile associated diarrhea if on metronidazole  The patient is not neutropenic and does not exhibit a GI malabsorption state  The patient is eating (either orally or via tube) and/or has been taking other orally administered medications for a least 24 hours  The patient is improving clinically and has a Tmax < 100.5  If you have questions about this conversion, please contact the Pharmacy Department  []   [phone] )  [facility]st[facility] Na [x]   [phone] )  [facility] Pontes  []   [phone] )  [facility] []   [phone] )  [place], PharmD, BCPS

## 2014-01-18 NOTE — Progress Notes (Addendum)
TELEMETRY: Reviewed telemetry pt in atrial flutter rate 70, HR dropping at times into 50s.Danley Danker Vitals:   01/16/14 2058 01/17/14 0350 01/17/14 1421 01/17/14 2100  BP: 108/57 149/88 107/70 112/59  Pulse: 82 109 61 64  Temp: 98.3 F (36.8 C) 98.2 F (36.8 C) 98.7 F (37.1 C) 98.3 F (36.8 C)  TempSrc: Axillary Axillary Oral Oral  Resp:  20 18 18   Height:      Weight:  152 lb 9.6 oz (69.219 kg)    SpO2: 94% 92% 97% 98%    Intake/Output Summary (Last 24 hours) at 01/18/14 0903 Last data filed at 01/18/14 0843  Gross per 24 hour  Intake    480 ml  Output      0 ml  Net    480 ml    SUBJECTIVE Much more alert today. States she feels much better. Still confused- oriented to self. Denies chest pain or SOB.  LABS: Basic Metabolic Panel:  Recent Labs  01/17/14 0715 01/18/14 0519  NA 140 137  K 4.4 4.7  CL 102 100  CO2 23 25  GLUCOSE 114* 97  BUN 40* 42*  CREATININE 1.12* 1.18*  CALCIUM 9.7 9.4   Liver Function Tests:  Recent Labs  01/16/14 1202  AST 13  ALT 7  ALKPHOS 96  BILITOT 0.2*  PROT 7.3  ALBUMIN 2.2*   No results found for this basename: LIPASE, AMYLASE,  in the last 72 hours CBC:  Recent Labs  01/17/14 0715 01/18/14 0519  WBC 8.8 7.7  HGB 10.5* 9.8*  HCT 34.3* 32.8*  MCV 96.1 96.8  PLT 423* 365   Cardiac Enzymes:  Recent Labs  01/16/14 1558 01/16/14 2200 01/17/14 0406  CKTOTAL 39  --   --   CKMB 1.9  --   --   TROPONINI <0.30 <0.30 <0.30   Dig level: 1.4  Anemia Panel:  Recent Labs  01/17/14 0406  VITAMINB12 720  FOLATE 8.0  FERRITIN 165  TIBC 246*  IRON 17*  RETICCTPCT 2.7    Radiology/Studies:  Dg Hip Complete Left  01/04/2014   CLINICAL DATA:  Limited mobility of the left leg  EXAM: LEFT HIP - COMPLETE 2+ VIEW  COMPARISON:  CT abdomen pelvis of 03/15/2006  FINDINGS: There is only mild degenerative joint disease of the left hip with slight loss of joint space and minimal spurring. No acute abnormality is seen. The  bones are osteopenic in this elderly patient. The left pelvic ramus is intact and there is degenerative change of the left SI joint noted.  IMPRESSION: Mild degenerative change.  No acute abnormality.   Electronically Signed   By: Ivar Drape M.D.   On: 01/04/2014 12:44   Dg Chest Port 1 View  01/16/2014   CLINICAL DATA:  Weakness and hypertension  EXAM: PORTABLE CHEST - 1 VIEW  COMPARISON:  03/05/2005  FINDINGS: Cardiac enlargement.  Negative for heart failure  Mild left lower lobe airspace disease and small left effusion. Possible pneumonia.  Surgical clips in the left axilla and left breast.  IMPRESSION: Left lower lobe airspace disease and small left effusion, question pneumonia   Electronically Signed   By: Franchot Gallo M.D.   On: 01/16/2014 15:27   Dg Knee Complete 4 Views Right  01/16/2014   CLINICAL DATA:  Right knee pain and swelling  EXAM: RIGHT KNEE - COMPLETE 4+ VIEW  COMPARISON:  DG KNEE 2 VIEWS*R* dated 03/26/2010  FINDINGS: Moderate narrowing of the medial compartment and  severe narrowing of the lateral compartment with mild medial and moderate to severe lateral osteophyte formation. Moderate narrowing of the patellofemoral compartment with moderate osteophyte formation. Moderate joint effusion present. No fracture or dislocation. Extensive below the knee and above the knee vascular calcification present.  IMPRESSION: Moderate to severe arthritis, mildly worse in the medial and patellofemoral compartments when compared to the prior study.   Electronically Signed   By: Skipper Cliche M.D.   On: 01/16/2014 16:33   Ecg: afib, LAFB, poor R wave progression  Echo:Study Conclusions  - Left ventricle: Small cavity with severe LVH. Decreased thickening of inferior septum. EF is in the 45% range. Findings consistent with left ventricular diastolic dysfunction. - Aortic valve: Moderately severe calcification with mild aortic stenosis. Mean gradient: 23mm Hg (S). Peak gradient: 67mm Hg  (S). - Left atrium: The atrium was moderately to severely dilated. - Right ventricle: The cavity size was mildly dilated. Systolic function was moderately reduced. - Right atrium: The atrium was mildly dilated. - Pulmonary arteries: PA peak pressure: 16mm Hg (S).   PHYSICAL EXAM General: Elderly, thin BF. Confused. Head: Normal. Hard of hearing. Missing dentition. Neck: Negative for carotid bruits. JVD not elevated. Lungs: Clear bilaterally to auscultation without wheezes, rales, or rhonchi. Breathing is unlabored. Heart: IRRR S1 S2 without murmurs, rubs, or gallops.  Abdomen: Soft, non-tender, non-distended with normoactive bowel sounds. No hepatomegaly. No rebound/guarding. No obvious abdominal masses. Extremities: No clubbing, cyanosis or edema.   Neuro: alert. Oriented to self. Very pleasant.  ASSESSMENT AND PLAN: 1. Elevated troponin. Only POC troponin elevated. All other troponins are normal suggesting POC was spurious. No clinical symptoms of ischemia. Continue beta blocker. No further cardiac work up planned.  2. Afib/flutter. Rate slow. Patient at high risk of Dig toxicity due to age and renal dysfunction. Will DC Dig. Lower dose of metoprolol. Consider resuming Pradaxa if ok with medical team.  3. HTN with hypertensive heart disease. Severe LVH by Echo with mildly decreased LV function.  Focus on BP control.  4. Anemia. 5. CKD stage 2-3  Principal Problem:   CAP (community acquired pneumonia) Active Problems:   Troponin I above reference range - on POC   Weakness   Hard of hearing   Anemia   Renal insufficiency   A-fib   Dementia with behavioral disturbance    Signed, Shannel Zahm M [place] MD,FACC 01/18/2014 9:03 AM

## 2014-01-18 NOTE — Care Management Note (Addendum)
    Page 1 of 1   01/21/2014     8:31:52 AM CARE MANAGEMENT NOTE 01/21/2014  Patient:  Hannah Manning, Hannah Manning   Account Number:  [id number]  Date Initiated:  01/18/2014  Documentation initiated by:  GRAVES-BIGELOW,BRENDA  Subjective/Objective Assessment:   Pt admitted for weakness. Pt is from home with family support. POer daughter Eulas Post pt has caregivers in the home, until 5 pm. Then family assists.     Action/Plan:   CM did speak to daughter Mariann Laster and she will chosse [facility] for [employer]. Pt has DME RW, [place] and [employer]. CM will monitor for disposition needs.   Anticipated DC Date:  01/19/2014   Anticipated DC Plan:  [place]  CM consult      [facility] Choice  HOME HEALTH   Choice offered to / List presented to:  C-4 Adult Children        HH arranged  HH-2 PT      [place].   Status of service:  Completed, signed off Medicare Important Message given?   (If response is "NO", the following Medicare IM given date fields will be blank) Date Medicare IM given:   Date Additional Medicare IM given:    Discharge Disposition:  [place]  Per UR Regulation:  Reviewed for med. necessity/level of care/duration of stay  If discussed at [place] of Stay Meetings, dates discussed:    Comments:  01/21/14 08:29 CM notified [facility] of pt discharge.  No other CM needs communicated.  Mariane Masters, BSN, [place] [phone].  01-19-14 [place], [place] [phone] CM did make referral with [facility] and SOC to begin within 24-48 hrs post d/c. CM did leave a list for [place] in the room for pt's daughter Mariann Laster. No further needs from CM at this time.

## 2014-01-19 MED ORDER — DABIGATRAN ETEXILATE MESYLATE 75 MG PO CAPS
75.0000 mg | ORAL_CAPSULE | Freq: Two times a day (BID) | ORAL | Status: DC
  Administered 2014-01-19 – 2014-01-20 (×2): 75 mg via ORAL
  Filled 2014-01-19 (×3): qty 1

## 2014-01-19 NOTE — Progress Notes (Signed)
Physical Therapy Treatment Patient Details Name: Hannah Manning MRN: [id number] DOB: Oct 31, 1920 Today's Date: 01/19/2014    History of Present Illness Pt is a 80 yo female with a pmhx significant for HTN, Afib, dementia, HOH and overall generalized weakness for several weeks; pt c/o right knee pain upon arrival to ED; pleasantly confused during admission to hospital; CXR on 01/16/14 yielded results of mild LLL airspace disease and small left effusion and possible pneumonia     PT Comments    Pt at baseline cognition today per dgtr who was present. Pt with decreased strength and reports generalized soreness but no right knee or leg pain today. Pt able to transfer OOB and perform gait with assist today with dgtr educated for how to assist pt and prevent falls with directing pt balance at home. Pt incontinent on arrival and assist for pericare with pt using adult briefs at home.  Will continue to follow.   Follow Up Recommendations  Home health PT;Supervision/Assistance - 24 hour     Equipment Recommendations       Recommendations for Other Services       Precautions / Restrictions Precautions Precautions: Fall    Mobility  Bed Mobility Overal bed mobility: Needs Assistance Bed Mobility: Rolling;Sidelying to Sit Rolling: Min assist Sidelying to sit: Min assist       General bed mobility comments: multimodal cues for rolling and side to sit with assist to complete rotation of pelvis and elevate trunk from surface with use of rails  Transfers Overall transfer level: Needs assistance   Transfers: Sit to/from Stand Sit to Stand: Mod assist;+2 safety/equipment         General transfer comment: cues for hand placement as pt pulling up on RW despite cues and able to reach for surface to sit with max cues. pt with flexed posture and posterior lean throughout standing with bil UE on RW  Ambulation/Gait Ambulation/Gait assistance: Mod assist;+2 safety/equipment Ambulation  Distance (Feet): 16 Feet Assistive device: Rolling walker (2 wheeled) Gait Pattern/deviations: Shuffle;Trunk flexed;Narrow base of support   Gait velocity interpretation: <1.8 ft/sec, indicative of risk for recurrent falls General Gait Details: pt requires assist for anterior translation and unable to extend hips and trunk in standing despite max cues and assist, chair to follow for safety and fatigue   Stairs            Wheelchair Mobility    Modified Rankin (Stroke Patients Only)       Balance Overall balance assessment: Needs assistance   Sitting balance-Leahy Scale: Fair   Postural control: Posterior lean   Standing balance-Leahy Scale: Zero                      Cognition Arousal/Alertness: Awake/alert Behavior During Therapy: WFL for tasks assessed/performed Overall Cognitive Status: History of cognitive impairments - at baseline                      Exercises General Exercises - Lower Extremity Long Arc Quad: AAROM;Seated;Both;10 reps Hip ABduction/ADduction: AAROM;Seated;Both;10 reps Hip Flexion/Marching: AAROM;Seated;Both;10 reps    General Comments        Pertinent Vitals/Pain Generalized soreness of both legs    Home Living                      Prior Function            PT Goals (current goals can now be found in the care plan  section) Progress towards PT goals: Progressing toward goals    Frequency       PT Plan Current plan remains appropriate    Co-evaluation             End of Session Equipment Utilized During Treatment: Gait belt Activity Tolerance: Patient tolerated treatment well Patient left: in chair;with call bell/phone within reach;with chair alarm set;with nursing/sitter in room     Time: 0814-4818 PT Time Calculation (min): 24 min  Charges:  $Therapeutic Exercise: 8-22 mins $Therapeutic Activity: 8-22 mins                    G Codes:      Tamana Hatfield B Lenord Fralix Feb 04, 2014, 10:20 AM Elwyn Reach, [place]

## 2014-01-19 NOTE — Progress Notes (Signed)
TELEMETRY: Reviewed telemetry pt in atrial flutter rate 70-90.: Filed Vitals:   01/18/14 1330 01/18/14 1425 01/18/14 1936 01/19/14 0628  BP: 91/62 117/67 107/60 137/69  Pulse:  94 96 96  Temp:  97.8 F (36.6 C) 97.4 F (36.3 C) 97.6 F (36.4 C)  TempSrc:  Oral Oral Oral  Resp:  20 20 20   Height:      Weight:    157 lb 4.8 oz (71.351 kg)  SpO2:  94% 93% 93%    Intake/Output Summary (Last 24 hours) at 01/19/14 1020 Last data filed at 01/18/14 1900  Gross per 24 hour  Intake    560 ml  Output      0 ml  Net    560 ml    SUBJECTIVE Much more alert today.  Denies chest pain or SOB.  LABS: Basic Metabolic Panel:  Recent Labs  01/17/14 0715 01/18/14 0519  NA 140 137  K 4.4 4.7  CL 102 100  CO2 23 25  GLUCOSE 114* 97  BUN 40* 42*  CREATININE 1.12* 1.18*  CALCIUM 9.7 9.4   Liver Function Tests:  Recent Labs  01/16/14 1202  AST 13  ALT 7  ALKPHOS 96  BILITOT 0.2*  PROT 7.3  ALBUMIN 2.2*   No results found for this basename: LIPASE, AMYLASE,  in the last 72 hours CBC:  Recent Labs  01/17/14 0715 01/18/14 0519  WBC 8.8 7.7  HGB 10.5* 9.8*  HCT 34.3* 32.8*  MCV 96.1 96.8  PLT 423* 365   Cardiac Enzymes:  Recent Labs  01/16/14 1558 01/16/14 2200 01/17/14 0406  CKTOTAL 39  --   --   CKMB 1.9  --   --   TROPONINI <0.30 <0.30 <0.30   Dig level: 1.4  Anemia Panel:  Recent Labs  01/17/14 0406 01/18/14 0519  VITAMINB12 720 570  FOLATE 8.0 7.2  FERRITIN 165  --   TIBC 246*  --   IRON 17*  --   RETICCTPCT 2.7  --     Radiology/Studies:  Dg Hip Complete Left  01/04/2014   CLINICAL DATA:  Limited mobility of the left leg  EXAM: LEFT HIP - COMPLETE 2+ VIEW  COMPARISON:  CT abdomen pelvis of 03/15/2006  FINDINGS: There is only mild degenerative joint disease of the left hip with slight loss of joint space and minimal spurring. No acute abnormality is seen. The bones are osteopenic in this elderly patient. The left pelvic ramus is intact and  there is degenerative change of the left SI joint noted.  IMPRESSION: Mild degenerative change.  No acute abnormality.   Electronically Signed   By: Ivar Drape M.D.   On: 01/04/2014 12:44   Dg Chest Port 1 View  01/16/2014   CLINICAL DATA:  Weakness and hypertension  EXAM: PORTABLE CHEST - 1 VIEW  COMPARISON:  03/05/2005  FINDINGS: Cardiac enlargement.  Negative for heart failure  Mild left lower lobe airspace disease and small left effusion. Possible pneumonia.  Surgical clips in the left axilla and left breast.  IMPRESSION: Left lower lobe airspace disease and small left effusion, question pneumonia   Electronically Signed   By: Franchot Gallo M.D.   On: 01/16/2014 15:27   Dg Knee Complete 4 Views Right  01/16/2014   CLINICAL DATA:  Right knee pain and swelling  EXAM: RIGHT KNEE - COMPLETE 4+ VIEW  COMPARISON:  DG KNEE 2 VIEWS*R* dated 03/26/2010  FINDINGS: Moderate narrowing of the medial compartment and severe narrowing  of the lateral compartment with mild medial and moderate to severe lateral osteophyte formation. Moderate narrowing of the patellofemoral compartment with moderate osteophyte formation. Moderate joint effusion present. No fracture or dislocation. Extensive below the knee and above the knee vascular calcification present.  IMPRESSION: Moderate to severe arthritis, mildly worse in the medial and patellofemoral compartments when compared to the prior study.   Electronically Signed   By: Skipper Cliche M.D.   On: 01/16/2014 16:33   Ecg: afib, LAFB, poor R wave progression  Echo:Study Conclusions  - Left ventricle: Small cavity with severe LVH. Decreased thickening of inferior septum. EF is in the 45% range. Findings consistent with left ventricular diastolic dysfunction. - Aortic valve: Moderately severe calcification with mild aortic stenosis. Mean gradient: 12mm Hg (S). Peak gradient: 30mm Hg (S). - Left atrium: The atrium was moderately to severely dilated. - Right ventricle:  The cavity size was mildly dilated. Systolic function was moderately reduced. - Right atrium: The atrium was mildly dilated. - Pulmonary arteries: PA peak pressure: 90mm Hg (S).   PHYSICAL EXAM General: Elderly, thin BF. Confused. Head: Normal. Hard of hearing. Missing dentition. Neck: Negative for carotid bruits. JVD not elevated. Lungs: Clear bilaterally to auscultation without wheezes, rales, or rhonchi. Breathing is unlabored. Heart: IRRR S1 S2 without murmurs, rubs, or gallops.  Abdomen: Soft, non-tender, non-distended with normoactive bowel sounds. No hepatomegaly. No rebound/guarding. No obvious abdominal masses. Extremities: No clubbing, cyanosis or edema.   Neuro: alert. Oriented to self. Very pleasant.  ASSESSMENT AND PLAN: 1. Elevated troponin. Only POC troponin elevated. All other troponins are normal suggesting POC was spurious. No clinical symptoms of ischemia. Continue beta blocker. No further cardiac work up planned.  2. Afib/flutter. Now well controlled on metoprolol 37.5 mg bid. Patient at high risk of Dig toxicity due to age and renal dysfunction. Will DC Dig. Pradaxa to be resumed when ok with medical team. 3. HTN with hypertensive heart disease. Severe LVH by Echo with mildly decreased LV function.  Focus on BP control.  4. Anemia. 5. CKD stage 2-3  Stable from cardiac standpoint now. Will sign off. Please call with questions.   Principal Problem:   CAP Active Problems:   Troponin I above reference range - on POC   Weakness   Hard of hearing   Anemia   Renal insufficiency   A-fib   Dementia with behavioral disturbance    Weston Brass Peter M Martinique MD,FACC 01/19/2014 10:20 AM

## 2014-01-19 NOTE — Progress Notes (Signed)
Patient ID: Hannah Manning  female  [id number]:[id number]    DOB: 1921-02-27    DOA: 01/16/2014  PCP: Myriam Jacobson, MD  Assessment/Plan: Principal Problem:  generalized Weakness with community acquired pneumonia - Continue Rocephin and Zithromax - Follow blood cultures negative to date -  urine strep antigen negative, no UTI  Active Problems: Atrial fibrillation with elevated troponin - POC troponin was elevated other than that troponins x3 negative - Cardiology was consulted and recommended no further cardiac workup. Continue beta blocker - For atrial fibrillation, patient at high risk for digoxin toxicity due to degenerated dysfunction, Toprol-XL was increased yesterday however heart rate slower today, hence lowered beta blocker dose.  - Will restart Pradaxa  - 2-D echo showed EF of 45 range, decreased thickening of the inferior septum, left ventricle diastolic dysfunction, moderately severe aortic calcification, moderate to severe left atrium dilation, right ventricle mildly dilated,RVSF systolic function moderately reduced  Right knee pain - X-ray shows moderate to severe arthritis, mildly worsened the medial and patellofemoral compartments, PT evaluation was done right knee pain was somewhat limiting the physical therapy - During physical therapy today and on examination patient's right knee pain has significantly improved   Acute encephalopathy with underlying dementia, behavioral issues -  Currently pleasant today, no acute issues     Anemia - Hemoglobin 9.8 stool occult test negative, has iron deficiency, place on iron replacement   DVT Prophylaxis: SCD's  Code Status: full Code   Family Communication: Discussed in detail with patient's daughter at the bedside, requested to DC home in a.m. Will arrange home health resources   Disposition:  Consultants:  Cardiology  ortho   Procedures:  2-D echo   Antibiotics:  IV Rocephin  IV Zithromax    Subjective: Patient alert and awake, much pleasant and cooperative today, no acute issues overnight. Denies any knee pain today  Objective: Weight change:   Intake/Output Summary (Last 24 hours) at 01/19/14 1316 Last data filed at 01/18/14 1900  Gross per 24 hour  Intake    240 ml  Output      0 ml  Net    240 ml   Blood pressure 106/61, pulse 96, temperature 97.6 F (36.4 C), temperature source Oral, resp. rate 20, height 5\' 4"  (1.626 m), weight 71.351 kg (157 lb 4.8 oz), SpO2 93.00%.  Physical Exam: General: Alert and awake, pleasant CVS: S1-S2 clear, no murmur rubs or gallops Chest: clear to auscultation bilaterally Abdomen: soft nontender, nondistended, normal bowel sounds  Extremities: no cyanosis, clubbing or edema noted bilaterally, range of movement improved in the right leg   Lab Results: Basic Metabolic Panel:  Recent Labs Lab 01/17/14 0715 01/18/14 0519  NA 140 137  K 4.4 4.7  CL 102 100  CO2 23 25  GLUCOSE 114* 97  BUN 40* 42*  CREATININE 1.12* 1.18*  CALCIUM 9.7 9.4   Liver Function Tests:  Recent Labs Lab 01/16/14 1202  AST 13  ALT 7  ALKPHOS 96  BILITOT 0.2*  PROT 7.3  ALBUMIN 2.2*   No results found for this basename: LIPASE, AMYLASE,  in the last 168 hours No results found for this basename: AMMONIA,  in the last 168 hours CBC:  Recent Labs Lab 01/17/14 0715 01/18/14 0519  WBC 8.8 7.7  HGB 10.5* 9.8*  HCT 34.3* 32.8*  MCV 96.1 96.8  PLT 423* 365   Cardiac Enzymes:  Recent Labs Lab 01/16/14 1558 01/16/14 2200 01/17/14 0406  CKTOTAL 39  --   --  CKMB 1.9  --   --   TROPONINI <0.30 <0.30 <0.30   BNP: No components found with this basename: POCBNP,  CBG:  Recent Labs Lab 01/16/14 1203  GLUCAP 122*     Micro Results: Recent Results (from the past 240 hour(s))  URINE CULTURE     Status: None   Collection Time    01/16/14  1:01 PM      Result Value Ref Range Status   Specimen Description URINE, RANDOM   Final    Special Requests Normal   Final   Culture  Setup Time     Final   Value: 01/16/2014 13:01     Performed at [facility]     Final   Value: NO GROWTH     Performed at [facility]   Culture     Final   Value: NO GROWTH     Performed at [facility]   Report Status 01/17/2014 FINAL   Final  CULTURE, BLOOD (ROUTINE X 2)     Status: None   Collection Time    01/16/14  7:30 PM      Result Value Ref Range Status   Specimen Description BLOOD RIGHT ARM   Final   Special Requests BOTTLES DRAWN AEROBIC ONLY 5CC   Final   Culture  Setup Time     Final   Value: 01/17/2014 00:15     Performed at [facility]   Culture     Final   Value:        BLOOD CULTURE RECEIVED NO GROWTH TO DATE CULTURE WILL BE HELD FOR 5 DAYS BEFORE ISSUING A FINAL NEGATIVE REPORT     Performed at [facility]   Report Status PENDING   Incomplete  CULTURE, BLOOD (ROUTINE X 2)     Status: None   Collection Time    01/16/14  7:40 PM      Result Value Ref Range Status   Specimen Description BLOOD RIGHT ARM   Final   Special Requests BOTTLES DRAWN AEROBIC ONLY 5CC   Final   Culture  Setup Time     Final   Value: 01/17/2014 00:15     Performed at [facility]   Culture     Final   Value:        BLOOD CULTURE RECEIVED NO GROWTH TO DATE CULTURE WILL BE HELD FOR 5 DAYS BEFORE ISSUING A FINAL NEGATIVE REPORT     Performed at [facility]   Report Status PENDING   Incomplete    Studies/Results: Dg Hip Complete Left  01/04/2014   CLINICAL DATA:  Limited mobility of the left leg  EXAM: LEFT HIP - COMPLETE 2+ VIEW  COMPARISON:  CT abdomen pelvis of 03/15/2006  FINDINGS: There is only mild degenerative joint disease of the left hip with slight loss of joint space and minimal spurring. No acute abnormality is seen. The bones are osteopenic in this elderly patient. The left pelvic ramus is intact and there is degenerative change of the left SI joint noted.   IMPRESSION: Mild degenerative change.  No acute abnormality.   Electronically Signed   By: Ivar Drape M.D.   On: 01/04/2014 12:44   Dg Chest Port 1 View  01/16/2014   CLINICAL DATA:  Weakness and hypertension  EXAM: PORTABLE CHEST - 1 VIEW  COMPARISON:  03/05/2005  FINDINGS: Cardiac enlargement.  Negative for heart failure  Mild left lower  lobe airspace disease and small left effusion. Possible pneumonia.  Surgical clips in the left axilla and left breast.  IMPRESSION: Left lower lobe airspace disease and small left effusion, question pneumonia   Electronically Signed   By: Franchot Gallo M.D.   On: 01/16/2014 15:27   Dg Knee Complete 4 Views Right  01/16/2014   CLINICAL DATA:  Right knee pain and swelling  EXAM: RIGHT KNEE - COMPLETE 4+ VIEW  COMPARISON:  DG KNEE 2 VIEWS*R* dated 03/26/2010  FINDINGS: Moderate narrowing of the medial compartment and severe narrowing of the lateral compartment with mild medial and moderate to severe lateral osteophyte formation. Moderate narrowing of the patellofemoral compartment with moderate osteophyte formation. Moderate joint effusion present. No fracture or dislocation. Extensive below the knee and above the knee vascular calcification present.  IMPRESSION: Moderate to severe arthritis, mildly worse in the medial and patellofemoral compartments when compared to the prior study.   Electronically Signed   By: Skipper Cliche M.D.   On: 01/16/2014 16:33    Medications: Scheduled Meds: . azithromycin  500 mg Oral Q24H  . cefTRIAXone (ROCEPHIN)  IV  1 g Intravenous Q24H  . feeding supplement (ENSURE COMPLETE)  237 mL Oral BID BM  . ferrous gluconate  324 mg Oral BID WC  . levothyroxine  175 mcg Oral QAC breakfast  . metoprolol tartrate  37.5 mg Oral BID  . simvastatin  20 mg Oral q1800      LOS: 3 days   Christy Friede Krystal Eaton M.D. [facility] 01/19/2014, 1:16 PM Pager: [phone]  If 7PM-7AM, please contact night-coverage [email or web address] Password  [email or web address]  **Disclaimer: This note was dictated with voice recognition software. Similar sounding words can inadvertently be transcribed and this note may contain transcription errors which may not have been corrected upon publication of note.**

## 2014-01-20 MED ORDER — CEFUROXIME AXETIL 500 MG PO TABS: 500.0000 mg | ORAL_TABLET | Freq: Two times a day (BID) | ORAL | Status: DC

## 2014-01-20 MED ORDER — AZITHROMYCIN 500 MG PO TABS: 500.0000 mg | ORAL_TABLET | Freq: Every day | ORAL | Status: DC

## 2014-01-20 MED ORDER — FERROUS GLUCONATE 324 (38 FE) MG PO TABS: 324.0000 mg | ORAL_TABLET | Freq: Two times a day (BID) | ORAL | Status: DC

## 2014-01-20 MED ORDER — METOPROLOL TARTRATE 12.5 MG HALF TABLET: 37.5000 mg | ORAL_TABLET | Freq: Two times a day (BID) | ORAL | Status: DC

## 2014-01-20 NOTE — Discharge Summary (Signed)
Physician Discharge Summary  Patient ID: Hannah Manning MRN: [id number] DOB/AGE: [birth year] 80 y.o.  Admit date: 01/16/2014 Discharge date: 01/20/2014  Primary Care Physician:  Myriam Jacobson, MD  Discharge Diagnoses:      Community acquired pneumonia  . Dementia with behavioral disturbance . Troponin I above reference range - on POC . generalized Weakness . Anemia . Hard of hearing . Renal insufficiency . A-fib   Consults: Cardiology   Recommendations for Outpatient Follow-up:  Please note that digoxin has been discontinued by cardiology due to high risk of toxicity due to age and renal dysfunction  Allergies:  No Known Allergies   Discharge Medications:   Medication List    STOP taking these medications       digoxin 0.125 MG tablet  Commonly known as:  LANOXIN     hydrochlorothiazide 25 MG tablet  Commonly known as:  HYDRODIURIL     metoprolol succinate 50 MG 24 hr tablet  Commonly known as:  TOPROL-XL      TAKE these medications       acetaminophen 500 MG tablet  Commonly known as:  TYLENOL  Take 1,000 mg by mouth every 6 (six) hours as needed for mild pain.     allopurinol 100 MG tablet  Commonly known as:  ZYLOPRIM  Take 100 mg by mouth daily.     azithromycin 500 MG tablet  Commonly known as:  ZITHROMAX  Take 1 tablet (500 mg total) by mouth daily. X 5 days     beta carotene w/minerals tablet  Take 1 tablet by mouth daily.     cefUROXime 500 MG tablet  Commonly known as:  CEFTIN  Take 1 tablet (500 mg total) by mouth 2 (two) times daily with a meal. X 5days     dabigatran 75 MG Caps capsule  Commonly known as:  PRADAXA  Take 75 mg by mouth 2 (two) times daily.     ferrous gluconate 324 MG tablet  Commonly known as:  FERGON  Take 1 tablet (324 mg total) by mouth 2 (two) times daily with a meal.     haloperidol 0.5 MG tablet  Commonly known as:  HALDOL  Take 0.5 mg by mouth daily.     levothyroxine 175 MCG tablet   Commonly known as:  SYNTHROID, LEVOTHROID  Take 175 mcg by mouth daily before breakfast.     metoprolol tartrate 12.5 mg Tabs tablet  Commonly known as:  LOPRESSOR  Take 1.5 tablets (37.5 mg total) by mouth 2 (two) times daily.     simvastatin 20 MG tablet  Commonly known as:  ZOCOR  Take 20 mg by mouth daily.     SYSTANE OP  Place 1 drop into both eyes daily.         Brief H and P: For complete details please refer to admission H and P, but in brief Hannah Manning is a 80 y.o. female with prior h/o hypertension, afib on pradaxa, was broughti n for generalized weakness since many weeks. On arrival to ED, she was found to be comfortable. Her only complaint was right knee pain. She denies any chest pain or sob. Her troponin was elevated. Cardiology consulted. She was referred to medical service for further evaluation.   Hospital Course:  Generalized Weakness with community acquired pneumonia. Patient was placed on IV Rocephin and Zithromax. She will continue oral Zithromax and Ceftin to complete the course. Blood cultures remained negative to date. Urine strep antigen negative, no  UTI.   Atrial fibrillation with elevated troponin  - POC troponin was elevated other than that troponins x3 negative. Cardiology was consulted and recommended no further cardiac workup. Continue beta blocker.  - For atrial fibrillation, patient at high risk for digoxin toxicity has to digoxin was discontinued. Initially Toprol-XL was  increased however heart rate then became slower in 50's, hence lowered beta blocker dose. Patient was continued on  Pradaxa. 2-D echo showed EF of 45 range, decreased thickening of the inferior septum, left ventricle diastolic dysfunction, moderately severe aortic calcification, moderate to severe left atrium dilation, right ventricle mildly dilated,RVSF systolic function moderately reduced   Right knee pain - improved, X-ray shows moderate to severe arthritis, mildly  worsened the medial and patellofemoral compartments. Home health PT OT will be arranged. She will benefit from outpatient orthopedics evaluation.   Acute encephalopathy with underlying dementia, behavioral issues Likely due to #1, the patient needed one dose of Haldol and her sitter initially however she subsequently became very pleasant and cooperative with no acute issues   Anemia  - Hemoglobin 9.8 stool occult test negative, has iron deficiency, place on iron replacement   Day of Discharge BP 105/73  Pulse 84  Temp(Src) 97.3 F (36.3 C) (Oral)  Resp 18  Ht 5\' 4"  (1.626 m)  Wt 71.351 kg (157 lb 4.8 oz)  BMI 26.99 kg/m2  SpO2 97%  Physical Exam:  General: Alert and awake, pleasant  CVS: S1-S2 clear, no murmur rubs or gallops  Chest: clear to auscultation bilaterally  Abdomen: soft nontender, nondistended, normal bowel sounds  Extremities: no cyanosis, clubbing or edema noted bilaterally, range of movement improved in the right leg  The results of significant diagnostics from this hospitalization (including imaging, microbiology, ancillary and laboratory) are listed below for reference.    LAB RESULTS: Basic Metabolic Panel:  Recent Labs Lab 01/17/14 0715 01/18/14 0519  NA 140 137  K 4.4 4.7  CL 102 100  CO2 23 25  GLUCOSE 114* 97  BUN 40* 42*  CREATININE 1.12* 1.18*  CALCIUM 9.7 9.4   Liver Function Tests:  Recent Labs Lab 01/16/14 1202  AST 13  ALT 7  ALKPHOS 96  BILITOT 0.2*  PROT 7.3  ALBUMIN 2.2*   No results found for this basename: LIPASE, AMYLASE,  in the last 168 hours No results found for this basename: AMMONIA,  in the last 168 hours CBC:  Recent Labs Lab 01/17/14 0715 01/18/14 0519  WBC 8.8 7.7  HGB 10.5* 9.8*  HCT 34.3* 32.8*  MCV 96.1 96.8  PLT 423* 365   Cardiac Enzymes:  Recent Labs Lab 01/16/14 1558 01/16/14 2200 01/17/14 0406  CKTOTAL 39  --   --   CKMB 1.9  --   --   TROPONINI <0.30 <0.30 <0.30   BNP: No components  found with this basename: POCBNP,  CBG:  Recent Labs Lab 01/16/14 1203  GLUCAP 122*    Significant Diagnostic Studies:  Dg Chest Port 1 View  01/16/2014   CLINICAL DATA:  Weakness and hypertension  EXAM: PORTABLE CHEST - 1 VIEW  COMPARISON:  03/05/2005  FINDINGS: Cardiac enlargement.  Negative for heart failure  Mild left lower lobe airspace disease and small left effusion. Possible pneumonia.  Surgical clips in the left axilla and left breast.  IMPRESSION: Left lower lobe airspace disease and small left effusion, question pneumonia   Electronically Signed   By: Franchot Gallo M.D.   On: 01/16/2014 15:27   Dg Knee Complete  4 Views Right  01/16/2014   CLINICAL DATA:  Right knee pain and swelling  EXAM: RIGHT KNEE - COMPLETE 4+ VIEW  COMPARISON:  DG KNEE 2 VIEWS*R* dated 03/26/2010  FINDINGS: Moderate narrowing of the medial compartment and severe narrowing of the lateral compartment with mild medial and moderate to severe lateral osteophyte formation. Moderate narrowing of the patellofemoral compartment with moderate osteophyte formation. Moderate joint effusion present. No fracture or dislocation. Extensive below the knee and above the knee vascular calcification present.  IMPRESSION: Moderate to severe arthritis, mildly worse in the medial and patellofemoral compartments when compared to the prior study.   Electronically Signed   By: Skipper Cliche M.D.   On: 01/16/2014 16:33    2D ECHO: Study Conclusions  - Left ventricle: Small cavity with severe LVH. Decreased thickening of inferior septum. EF is in the 45% range. Findings consistent with left ventricular diastolic dysfunction. - Aortic valve: Moderately severe calcification with mild aortic stenosis. Mean gradient: 15mm Hg (S). Peak gradient: 20mm Hg (S). - Left atrium: The atrium was moderately to severely dilated. - Right ventricle: The cavity size was mildly dilated. Systolic function was moderately reduced. - Right atrium: The  atrium was mildly dilated. - Pulmonary arteries: PA peak pressure: 19mm Hg (S).    Disposition and Follow-up:     Discharge Orders   Future Orders Complete By Expires   Diet - low sodium heart healthy  As directed    Increase activity slowly  As directed        DISPOSITION: Home    DISCHARGE FOLLOW-UP Follow-up Information   Follow up with [facility]. (Physical Therapy)    Contact information:   [address] [place] [address] [phone]       Follow up with ROBERTS, Sharol Given, MD. Schedule an appointment as soon as possible for a visit in 2 weeks. (for hospital follow-up)    Specialty:  Internal Medicine   Contact information:   [place], [place] [address] [phone]       Time spent on Discharge: 40 minutes  Signed:   Mendel Corning M.D. [facility] 01/20/2014, 12:34 PM Pager: [phone]   **Disclaimer: This note was dictated with voice recognition software. Similar sounding words can inadvertently be transcribed and this note may contain transcription errors which may not have been corrected upon publication of note.**

## 2014-01-23 LAB — CULTURE, BLOOD (ROUTINE X 2)
CULTURE: NO GROWTH
Culture: NO GROWTH

## 2014-02-14 ENCOUNTER — Inpatient Hospital Stay (HOSPITAL_COMMUNITY)
Admission: EM | Admit: 2014-02-14 | Discharge: 2014-02-21 | DRG: 291 | Disposition: A | Discharge status: Hospice | Payer: Medicare Other | Attending: Internal Medicine | Admitting: Internal Medicine

## 2014-02-14 ENCOUNTER — Encounter (HOSPITAL_COMMUNITY): Payer: Self-pay | Admitting: Emergency Medicine

## 2014-02-14 ENCOUNTER — Emergency Department (HOSPITAL_COMMUNITY): Payer: Medicare Other

## 2014-02-14 DIAGNOSIS — H9190 Unspecified hearing loss, unspecified ear: Secondary | ICD-10-CM | POA: Diagnosis present

## 2014-02-14 DIAGNOSIS — I4891 Unspecified atrial fibrillation: Secondary | ICD-10-CM

## 2014-02-14 DIAGNOSIS — Z7901 Long term (current) use of anticoagulants: Secondary | ICD-10-CM

## 2014-02-14 DIAGNOSIS — Z853 Personal history of malignant neoplasm of breast: Secondary | ICD-10-CM

## 2014-02-14 DIAGNOSIS — R000 Tachycardia, unspecified: Secondary | ICD-10-CM

## 2014-02-14 DIAGNOSIS — J189 Pneumonia, unspecified organism: Secondary | ICD-10-CM

## 2014-02-14 DIAGNOSIS — E039 Hypothyroidism, unspecified: Secondary | ICD-10-CM | POA: Diagnosis present

## 2014-02-14 DIAGNOSIS — L8990 Pressure ulcer of unspecified site, unspecified stage: Secondary | ICD-10-CM | POA: Diagnosis present

## 2014-02-14 DIAGNOSIS — I509 Heart failure, unspecified: Secondary | ICD-10-CM

## 2014-02-14 DIAGNOSIS — Z79899 Other long term (current) drug therapy: Secondary | ICD-10-CM

## 2014-02-14 DIAGNOSIS — F0391 Unspecified dementia with behavioral disturbance: Secondary | ICD-10-CM

## 2014-02-14 DIAGNOSIS — Z8673 Personal history of transient ischemic attack (TIA), and cerebral infarction without residual deficits: Secondary | ICD-10-CM

## 2014-02-14 DIAGNOSIS — L8991 Pressure ulcer of unspecified site, stage 1: Secondary | ICD-10-CM | POA: Diagnosis present

## 2014-02-14 DIAGNOSIS — F411 Generalized anxiety disorder: Secondary | ICD-10-CM | POA: Diagnosis present

## 2014-02-14 DIAGNOSIS — Z515 Encounter for palliative care: Secondary | ICD-10-CM

## 2014-02-14 DIAGNOSIS — I129 Hypertensive chronic kidney disease with stage 1 through stage 4 chronic kidney disease, or unspecified chronic kidney disease: Secondary | ICD-10-CM | POA: Diagnosis present

## 2014-02-14 DIAGNOSIS — R0609 Other forms of dyspnea: Secondary | ICD-10-CM

## 2014-02-14 DIAGNOSIS — I959 Hypotension, unspecified: Secondary | ICD-10-CM | POA: Diagnosis present

## 2014-02-14 DIAGNOSIS — F03918 Unspecified dementia, unspecified severity, with other behavioral disturbance: Secondary | ICD-10-CM

## 2014-02-14 DIAGNOSIS — E43 Unspecified severe protein-calorie malnutrition: Secondary | ICD-10-CM | POA: Diagnosis present

## 2014-02-14 DIAGNOSIS — R0600 Dyspnea, unspecified: Secondary | ICD-10-CM | POA: Diagnosis present

## 2014-02-14 DIAGNOSIS — Z66 Do not resuscitate: Secondary | ICD-10-CM | POA: Diagnosis present

## 2014-02-14 DIAGNOSIS — I359 Nonrheumatic aortic valve disorder, unspecified: Secondary | ICD-10-CM | POA: Diagnosis present

## 2014-02-14 DIAGNOSIS — N189 Chronic kidney disease, unspecified: Secondary | ICD-10-CM | POA: Diagnosis present

## 2014-02-14 DIAGNOSIS — I5043 Acute on chronic combined systolic (congestive) and diastolic (congestive) heart failure: Principal | ICD-10-CM

## 2014-02-14 DIAGNOSIS — D649 Anemia, unspecified: Secondary | ICD-10-CM

## 2014-02-14 DIAGNOSIS — R778 Other specified abnormalities of plasma proteins: Secondary | ICD-10-CM

## 2014-02-14 DIAGNOSIS — I5023 Acute on chronic systolic (congestive) heart failure: Secondary | ICD-10-CM

## 2014-02-14 DIAGNOSIS — R339 Retention of urine, unspecified: Secondary | ICD-10-CM | POA: Diagnosis not present

## 2014-02-14 DIAGNOSIS — R7989 Other specified abnormal findings of blood chemistry: Secondary | ICD-10-CM

## 2014-02-14 DIAGNOSIS — R531 Weakness: Secondary | ICD-10-CM

## 2014-02-14 DIAGNOSIS — R0989 Other specified symptoms and signs involving the circulatory and respiratory systems: Secondary | ICD-10-CM

## 2014-02-14 DIAGNOSIS — N289 Disorder of kidney and ureter, unspecified: Secondary | ICD-10-CM

## 2014-02-14 DIAGNOSIS — E785 Hyperlipidemia, unspecified: Secondary | ICD-10-CM | POA: Diagnosis present

## 2014-02-14 HISTORY — DX: Heart failure, unspecified: I50.9

## 2014-02-14 HISTORY — DX: Shortness of breath: R06.02

## 2014-02-14 LAB — CBC WITH DIFFERENTIAL/PLATELET
Basophils Absolute: 0 10*3/uL (ref 0.0–0.1)
Basophils Relative: 1 % (ref 0–1)
EOS ABS: 0.1 10*3/uL (ref 0.0–0.7)
EOS PCT: 1 % (ref 0–5)
HCT: 36.8 % (ref 36.0–46.0)
Hemoglobin: 11.4 g/dL — ABNORMAL LOW (ref 12.0–15.0)
Lymphocytes Relative: 12 % (ref 12–46)
Lymphs Abs: 0.8 10*3/uL (ref 0.7–4.0)
MCH: 30.4 pg (ref 26.0–34.0)
MCHC: 31 g/dL (ref 30.0–36.0)
MCV: 98.1 fL (ref 78.0–100.0)
Monocytes Absolute: 0.5 10*3/uL (ref 0.1–1.0)
Monocytes Relative: 8 % (ref 3–12)
Neutro Abs: 5.5 10*3/uL (ref 1.7–7.7)
Neutrophils Relative %: 78 % — ABNORMAL HIGH (ref 43–77)
PLATELETS: 386 10*3/uL (ref 150–400)
RBC: 3.75 MIL/uL — ABNORMAL LOW (ref 3.87–5.11)
RDW: 16.4 % — ABNORMAL HIGH (ref 11.5–15.5)
WBC: 7 10*3/uL (ref 4.0–10.5)

## 2014-02-14 LAB — CREATININE, SERUM
Creatinine, Ser: 1.05 mg/dL (ref 0.50–1.10)
GFR calc Af Amer: 52 mL/min — ABNORMAL LOW (ref >90)
GFR calc non Af Amer: 45 mL/min — ABNORMAL LOW (ref >90)

## 2014-02-14 LAB — URINALYSIS, ROUTINE W REFLEX MICROSCOPIC
Bilirubin Urine: NEGATIVE
Glucose, UA: NEGATIVE mg/dL
Hgb urine dipstick: NEGATIVE
Ketones, ur: NEGATIVE mg/dL
LEUKOCYTES UA: NEGATIVE
Nitrite: NEGATIVE
Protein, ur: NEGATIVE mg/dL
SPECIFIC GRAVITY, URINE: 1.023 (ref 1.005–1.030)
UROBILINOGEN UA: 0.2 mg/dL (ref 0.0–1.0)
pH: 5 (ref 5.0–8.0)

## 2014-02-14 LAB — CBC
HEMATOCRIT: 33.5 % — AB (ref 36.0–46.0)
Hemoglobin: 10.1 g/dL — ABNORMAL LOW (ref 12.0–15.0)
MCH: 30.1 pg (ref 26.0–34.0)
MCHC: 30.1 g/dL (ref 30.0–36.0)
MCV: 100 fL (ref 78.0–100.0)
PLATELETS: 360 10*3/uL (ref 150–400)
RBC: 3.35 MIL/uL — AB (ref 3.87–5.11)
RDW: 16.3 % — ABNORMAL HIGH (ref 11.5–15.5)
WBC: 6.2 10*3/uL (ref 4.0–10.5)

## 2014-02-14 LAB — COMPREHENSIVE METABOLIC PANEL
ALT: 13 U/L (ref 0–35)
AST: 22 U/L (ref 0–37)
Albumin: 2.4 g/dL — ABNORMAL LOW (ref 3.5–5.2)
Alkaline Phosphatase: 110 U/L (ref 39–117)
BUN: 27 mg/dL — ABNORMAL HIGH (ref 6–23)
CALCIUM: 9.9 mg/dL (ref 8.4–10.5)
CO2: 26 mEq/L (ref 19–32)
CREATININE: 1.1 mg/dL (ref 0.50–1.10)
Chloride: 97 mEq/L (ref 96–112)
GFR calc non Af Amer: 43 mL/min — ABNORMAL LOW (ref >90)
GFR, EST AFRICAN AMERICAN: 49 mL/min — AB (ref >90)
GLUCOSE: 186 mg/dL — AB (ref 70–99)
Potassium: 4.7 mEq/L (ref 3.7–5.3)
Sodium: 135 mEq/L — ABNORMAL LOW (ref 137–147)
TOTAL PROTEIN: 7.5 g/dL (ref 6.0–8.3)
Total Bilirubin: 0.3 mg/dL (ref 0.3–1.2)

## 2014-02-14 LAB — TROPONIN I: Troponin I: 0.31 ng/mL (ref <0.30)

## 2014-02-14 LAB — TSH: TSH: 10.93 u[IU]/mL — ABNORMAL HIGH (ref 0.350–4.500)

## 2014-02-14 LAB — I-STAT CG4 LACTIC ACID, ED: LACTIC ACID, VENOUS: 2.25 mmol/L — AB (ref 0.5–2.2)

## 2014-02-14 LAB — PRO B NATRIURETIC PEPTIDE: Pro B Natriuretic peptide (BNP): 21821 pg/mL — ABNORMAL HIGH (ref 0–450)

## 2014-02-14 LAB — MAGNESIUM: Magnesium: 1.9 mg/dL (ref 1.5–2.5)

## 2014-02-14 LAB — DIGOXIN LEVEL: Digoxin Level: <0.3 ng/mL — ABNORMAL LOW (ref 0.8–2.0)

## 2014-02-14 MED ORDER — METOPROLOL TARTRATE 25 MG PO TABS
50.0000 mg | ORAL_TABLET | Freq: Once | ORAL | Status: DC
  Filled 2014-02-14: qty 2

## 2014-02-14 MED ORDER — HALOPERIDOL 0.5 MG PO TABS
0.5000 mg | ORAL_TABLET | Freq: Every day | ORAL | Status: DC
  Administered 2014-02-14 – 2014-02-15 (×2): 0.5 mg via ORAL
  Filled 2014-02-14 (×2): qty 1

## 2014-02-14 MED ORDER — ONDANSETRON HCL 4 MG/2ML IJ SOLN: 4.0000 mg | Freq: Three times a day (TID) | INTRAMUSCULAR | Status: DC | PRN

## 2014-02-14 MED ORDER — FUROSEMIDE 10 MG/ML IJ SOLN
40.0000 mg | Freq: Once | INTRAMUSCULAR | Status: AC
  Administered 2014-02-14: 40 mg via INTRAVENOUS
  Filled 2014-02-14: qty 4

## 2014-02-14 MED ORDER — OCUVITE PO TABS
1.0000 | ORAL_TABLET | Freq: Every day | ORAL | Status: DC
  Administered 2014-02-14 – 2014-02-19 (×6): 1 via ORAL
  Filled 2014-02-14 (×6): qty 1

## 2014-02-14 MED ORDER — SODIUM CHLORIDE 0.9 % IV SOLN
1000.0000 mL | INTRAVENOUS | Status: DC
  Administered 2014-02-14: 1000 mL via INTRAVENOUS

## 2014-02-14 MED ORDER — CARVEDILOL 3.125 MG PO TABS
3.1250 mg | ORAL_TABLET | Freq: Two times a day (BID) | ORAL | Status: DC
  Administered 2014-02-15: 3.125 mg via ORAL
  Filled 2014-02-14 (×3): qty 1

## 2014-02-14 MED ORDER — LABETALOL HCL 5 MG/ML IV SOLN
10.0000 mg | Freq: Once | INTRAVENOUS | Status: AC
  Administered 2014-02-14: 10 mg via INTRAVENOUS
  Filled 2014-02-14: qty 4

## 2014-02-14 MED ORDER — ALBUTEROL SULFATE (2.5 MG/3ML) 0.083% IN NEBU: 2.5000 mg | INHALATION_SOLUTION | RESPIRATORY_TRACT | Status: DC | PRN

## 2014-02-14 MED ORDER — DIGOXIN 0.0625 MG HALF TABLET
0.0625 mg | ORAL_TABLET | Freq: Every day | ORAL | Status: DC
  Administered 2014-02-14 – 2014-02-15 (×2): 0.0625 mg via ORAL
  Filled 2014-02-14 (×2): qty 1

## 2014-02-14 MED ORDER — SODIUM CHLORIDE 0.9 % IV SOLN: 250.0000 mL | INTRAVENOUS | Status: DC | PRN

## 2014-02-14 MED ORDER — SODIUM CHLORIDE 0.9 % IJ SOLN
3.0000 mL | Freq: Two times a day (BID) | INTRAMUSCULAR | Status: DC
  Administered 2014-02-14 – 2014-02-21 (×14): 3 mL via INTRAVENOUS

## 2014-02-14 MED ORDER — DABIGATRAN ETEXILATE MESYLATE 75 MG PO CAPS
75.0000 mg | ORAL_CAPSULE | Freq: Two times a day (BID) | ORAL | Status: DC
  Administered 2014-02-14 – 2014-02-15 (×2): 75 mg via ORAL
  Filled 2014-02-14 (×4): qty 1

## 2014-02-14 MED ORDER — SODIUM CHLORIDE 0.9 % IJ SOLN
3.0000 mL | INTRAMUSCULAR | Status: DC | PRN
  Administered 2014-02-18: 3 mL via INTRAVENOUS

## 2014-02-14 MED ORDER — LEVOFLOXACIN IN D5W 500 MG/100ML IV SOLN
500.0000 mg | Freq: Once | INTRAVENOUS | Status: AC
  Administered 2014-02-14: 500 mg via INTRAVENOUS
  Filled 2014-02-14: qty 100

## 2014-02-14 MED ORDER — CARVEDILOL 3.125 MG PO TABS
1.5600 mg | ORAL_TABLET | Freq: Once | ORAL | Status: AC
  Administered 2014-02-14: 1.56 mg via ORAL
  Filled 2014-02-14: qty 0.5

## 2014-02-14 MED ORDER — LEVOTHYROXINE SODIUM 175 MCG PO TABS
175.0000 ug | ORAL_TABLET | Freq: Every day | ORAL | Status: DC
  Administered 2014-02-15: 175 ug via ORAL
  Filled 2014-02-14 (×3): qty 1

## 2014-02-14 MED ORDER — SIMVASTATIN 20 MG PO TABS
20.0000 mg | ORAL_TABLET | Freq: Every day | ORAL | Status: DC
  Administered 2014-02-14 – 2014-02-18 (×4): 20 mg via ORAL
  Filled 2014-02-14 (×6): qty 1

## 2014-02-14 MED ORDER — FERROUS GLUCONATE 324 (38 FE) MG PO TABS
324.0000 mg | ORAL_TABLET | Freq: Two times a day (BID) | ORAL | Status: DC
  Administered 2014-02-14: 324 mg via ORAL
  Filled 2014-02-14 (×5): qty 1

## 2014-02-14 NOTE — ED Notes (Signed)
Lactic acid results given to Dr. Vanita Panda

## 2014-02-14 NOTE — Consult Note (Signed)
Reason for Consult: AFib w/ RVR Referring Physician: TRH    HPI: The patient is a 80 y/o female, with a prior history of hypertension, combined systolic + diastolic HF (EF of 50% on echo 01/17/14), mild AS and atrial fibrillation, anticoagulated with Pradaxa. This has been managed by her PCP. She was recently admitted to [facility] from 01/16/14 through 01/20/14. At that time, she presented with a chief complaint of generalized weakness and after workup she was diagnosed with community-acquired pneumonia, treated with antibiotics. She was also noted to be in atrial fibrillation with an elevated point of care troponin. Her actual lab troponins were cycled x 3 and were negative. We were consulted for recommendations. Since she had no clinical symptoms of ischemia, we felt no further cardiac workup was needed. We recommended continuation of her beta blocker. We also recommended discontinuation of digoxin, as she was felt to be high risk for toxicity given her age and renal dysfunction. We also recommended that her Pardaxa be held due to anemia.  She was discharged home on 01/20/14 and presented back today with a complaint of progressive SOB over the past week, with associated weight gain and bilateral LEE. Based on H&P, it appears that her family reported that she has had problems with hypotension over the past several weeks, for which they have not given metoprolol on a daily basis. Per discharge summary on 01/20/2014 she was discharged on Metoprolol 37.5 mg by mouth twice a day. In the emergency room he was found to be in A. fib with RVR. Ventricular rates were in the 120s. She was administered IV labetalol. Her HR improved, however she became hypotensive with SBP in the 80s. She has been admitted by [facility].  Labs including CBC, Mg, TSH and a U/A are pending.    Past Medical History  Diagnosis Date  . Hypothyroid 1989  . Hypertension   . A-fib     For about 5 years  . Hard of hearing   . Family  history of anesthesia complication     /daughter, Gwen "had a reaction, welps to something they gave me"  . High cholesterol   . Heart murmur   . Pneumonia 01/16/2014    "a little on one side; never had it before"   . Anemia   . History of blood transfusion     "w/hysterectomy"  . H/O hiatal hernia   . GERD (gastroesophageal reflux disease)   . Stroke     "they think she had a mini stroke 2 days ago" (01/18/2014)  . Arthritis     "joints; all over" (01/18/2014)  . Anxiety   . Breast cancer     "right breast"  . Dementia     "don't know stage or type" (01/18/2014)    Past Surgical History  Procedure Laterality Date  . Abdominal hysterectomy    . Carpal tunnel release Right   . Cataract extraction w/ intraocular lens  implant, bilateral Bilateral   . Breast biopsy Right   . Breast lumpectomy Right     No family history on file.  Social History:  reports that she has never smoked. She has never used smokeless tobacco. She reports that she does not drink alcohol or use illicit drugs.  Allergies: No Known Allergies  Medications:  Prior to Admission medications   Medication Sig Start Date End Date Taking? Authorizing Provider  acetaminophen (TYLENOL) 500 MG tablet Take 1,000 mg by mouth every 6 (six) hours as needed for mild  pain.    Historical Provider, MD  allopurinol (ZYLOPRIM) 100 MG tablet Take 100 mg by mouth daily.    Historical Provider, MD  azithromycin (ZITHROMAX) 500 MG tablet Take 1 tablet (500 mg total) by mouth daily. X 5 days 01/20/14   Ripudeep Krystal Eaton, MD  beta carotene w/minerals (OCUVITE) tablet Take 1 tablet by mouth daily.    Historical Provider, MD  cefUROXime (CEFTIN) 500 MG tablet Take 1 tablet (500 mg total) by mouth 2 (two) times daily with a meal. X 5days 01/20/14   Ripudeep Krystal Eaton, MD  dabigatran (PRADAXA) 75 MG CAPS capsule Take 75 mg by mouth 2 (two) times daily.    Historical Provider, MD  ferrous gluconate (FERGON) 324 MG tablet Take 1 tablet (324 mg  total) by mouth 2 (two) times daily with a meal. 01/20/14   Ripudeep K Rai, MD  haloperidol (HALDOL) 0.5 MG tablet Take 0.5 mg by mouth daily.    Historical Provider, MD  levothyroxine (SYNTHROID, LEVOTHROID) 175 MCG tablet Take 175 mcg by mouth daily before breakfast.    Historical Provider, MD  metoprolol tartrate (LOPRESSOR) 12.5 mg TABS tablet Take 1.5 tablets (37.5 mg total) by mouth 2 (two) times daily. 01/20/14   Ripudeep Krystal Eaton, MD  Polyethyl Glycol-Propyl Glycol (SYSTANE OP) Place 1 drop into both eyes daily.    Historical Provider, MD  simvastatin (ZOCOR) 20 MG tablet Take 20 mg by mouth daily.    Historical Provider, MD     Results for orders placed during the hospital encounter of 02/14/14 (from the past 48 hour(s))  CBC WITH DIFFERENTIAL     Status: Abnormal   Collection Time    02/14/14  9:30 AM      Result Value Ref Range   WBC 7.0  4.0 - 10.5 K/uL   RBC 3.75 (*) 3.87 - 5.11 MIL/uL   Hemoglobin 11.4 (*) 12.0 - 15.0 g/dL   HCT 36.8  36.0 - 46.0 %   MCV 98.1  78.0 - 100.0 fL   MCH 30.4  26.0 - 34.0 pg   MCHC 31.0  30.0 - 36.0 g/dL   RDW 16.4 (*) 11.5 - 15.5 %   Platelets 386  150 - 400 K/uL   Neutrophils Relative % 78 (*) 43 - 77 %   Neutro Abs 5.5  1.7 - 7.7 K/uL   Lymphocytes Relative 12  12 - 46 %   Lymphs Abs 0.8  0.7 - 4.0 K/uL   Monocytes Relative 8  3 - 12 %   Monocytes Absolute 0.5  0.1 - 1.0 K/uL   Eosinophils Relative 1  0 - 5 %   Eosinophils Absolute 0.1  0.0 - 0.7 K/uL   Basophils Relative 1  0 - 1 %   Basophils Absolute 0.0  0.0 - 0.1 K/uL  COMPREHENSIVE METABOLIC PANEL     Status: Abnormal   Collection Time    02/14/14  9:30 AM      Result Value Ref Range   Sodium 135 (*) 137 - 147 mEq/L   Potassium 4.7  3.7 - 5.3 mEq/L   Chloride 97  96 - 112 mEq/L   CO2 26  19 - 32 mEq/L   Glucose, Bld 186 (*) 70 - 99 mg/dL   BUN 27 (*) 6 - 23 mg/dL   Creatinine, Ser 1.10  0.50 - 1.10 mg/dL   Calcium 9.9  8.4 - 10.5 mg/dL   Total Protein 7.5  6.0 - 8.3 g/dL  Albumin 2.4 (*) 3.5 - 5.2 g/dL   AST 22  0 - 37 U/L   ALT 13  0 - 35 U/L   Alkaline Phosphatase 110  39 - 117 U/L   Total Bilirubin 0.3  0.3 - 1.2 mg/dL   GFR calc non Af Amer 43 (*) >90 mL/min   GFR calc Af Amer 49 (*) >90 mL/min   Comment: (NOTE)     The eGFR has been calculated using the CKD EPI equation.     This calculation has not been validated in all clinical situations.     eGFR's persistently <90 mL/min signify possible Chronic Kidney     Disease.  PRO B NATRIURETIC PEPTIDE     Status: Abnormal   Collection Time    02/14/14  9:30 AM      Result Value Ref Range   Pro B Natriuretic peptide (BNP) 21821.0 (*) 0 - 450 pg/mL  DIGOXIN LEVEL     Status: Abnormal   Collection Time    02/14/14  9:30 AM      Result Value Ref Range   Digoxin Level <0.3 (*) 0.8 - 2.0 ng/mL  I-STAT CG4 LACTIC ACID, ED     Status: Abnormal   Collection Time    02/14/14 10:39 AM      Result Value Ref Range   Lactic Acid, Venous 2.25 (*) 0.5 - 2.2 mmol/L  URINALYSIS, ROUTINE W REFLEX MICROSCOPIC     Status: None   Collection Time    02/14/14 12:02 PM      Result Value Ref Range   Color, Urine YELLOW  YELLOW   APPearance CLEAR  CLEAR   Specific Gravity, Urine 1.023  1.005 - 1.030   pH 5.0  5.0 - 8.0   Glucose, UA NEGATIVE  NEGATIVE mg/dL   Hgb urine dipstick NEGATIVE  NEGATIVE   Bilirubin Urine NEGATIVE  NEGATIVE   Ketones, ur NEGATIVE  NEGATIVE mg/dL   Protein, ur NEGATIVE  NEGATIVE mg/dL   Urobilinogen, UA 0.2  0.0 - 1.0 mg/dL   Nitrite NEGATIVE  NEGATIVE   Leukocytes, UA NEGATIVE  NEGATIVE   Comment: MICROSCOPIC NOT DONE ON URINES WITH NEGATIVE PROTEIN, BLOOD, LEUKOCYTES, NITRITE, OR GLUCOSE <1000 mg/dL.    Dg Chest Port 1 View  02/14/2014   CLINICAL DATA:  Shortness of breath  EXAM: PORTABLE CHEST - 1 VIEW  COMPARISON:  DG CHEST 1V PORT dated 01/16/2014  FINDINGS: There are bilateral interstitial and alveolar airspace opacities. There are bilateral small pleural effusions, right greater than  left. There is no pneumothorax. There is stable cardiomegaly. The osseous structures are unremarkable. There are surgical clips in the left axilla from prior axillary dissection.  IMPRESSION: Bilateral interstitial and alveolar airspace opacities with bilateral small pleural effusions concerning for pulmonary edema. Alternatively multi lobar pneumonia can have a similar appearance.   Electronically Signed   By: Kathreen Devoid   On: 02/14/2014 10:30    Review of Systems  Constitutional: Negative for fever and chills.  Respiratory: Positive for cough and shortness of breath. Negative for sputum production.   Cardiovascular: Positive for palpitations and leg swelling. Negative for chest pain.  Neurological: Negative for dizziness and loss of consciousness.  All other systems reviewed and are negative.  Blood pressure 90/65, pulse 113, temperature 98 F (36.7 C), temperature source Axillary, resp. rate 22, SpO2 98.00%. Physical Exam  Constitutional: She is oriented to person, place, and time. She appears well-developed and well-nourished.  Neck: JVD present.  Cardiovascular: Regular rhythm.  Tachycardia present.   Murmur (2/6 SM) heard. Pulses:      Radial pulses are 2+ on the right side, and 2+ on the left side.       Dorsalis pedis pulses are 2+ on the right side, and 2+ on the left side.  Respiratory: Effort normal and breath sounds normal. No respiratory distress.  Faint bibasilar rales   Musculoskeletal: She exhibits edema (1-2+ bilateral pitting).  Neurological: She is alert and oriented to person, place, and time.  Skin: Skin is warm and dry.  Psychiatric: She has a normal mood and affect. Her behavior is normal.    Assessment/Plan: Active Problems:   Renal insufficiency   A-fib   Dementia with behavioral disturbance   Dyspnea   Acute on chronic systolic heart failure   CHF (congestive heart failure)  1. PAF- currently in sinus tach on telemetry with HR ~110 bpm and not Afib.  She received IV labetalol in the ED, but became hypotensive with SB in the 80s. BP has rebound to 02X systolic. HR control treatment options are limited due to soft BP. Recommend checking a TSH. Continue Xarelto.   2. Acute on Chronic combined systolic + diastolic CHF: EF of 11% on recent echo. In the setting of held diuretic due to hypotension at home. [place] [address]. She received a dose of 40 mg IV Lasix in ED. Monitor strict I/Os and daily weights. Low sodium diet. She will need more lasix, as BP allows. Scr is 1.10.   Brittainy Simmons 02/14/2014, 1:49 PM    Patient seen and examined. Agree with assessment and plan. Pleasant 80 yo AAF with HTN, mod AS by echo in 12/2013 with AVA 1.24; mean gradient 13 and peak 26, admitted now with CHF and tachycardia initally thought to be in AF but probable sinus tachycardia with frequent PAC's. HR now 114 after 10 mg labetolol and Lasix 40 mg. Cr 1.1; pro BNP 55208. Will need to treat CHF with IV lasix; consider changing metoprolol to carvedilol and may benefit from re-institution of 0.0625 Lanoxin. Would check troponins. Does not appear to be a candidate for AVR; would treat medically. If she has recurrent AF and BP remains low would institute amiodarone.     Troy Sine, MD, [facility] 02/14/2014 3:49 PM

## 2014-02-14 NOTE — ED Notes (Signed)
Family at bedside. 

## 2014-02-14 NOTE — H&P (Signed)
[facility] History and Physical  Hannah Manning KZS:[id number] DOB: [birth year] DOA: 02/14/2014  Referring physician:  PCP: Myriam Jacobson, MD   Chief Complaint: Shortness of breath  HPI: Hannah Manning is a 79 y.o. female with a past medical history of by systolic and diastolic congestive heart failure having her last transthoracic echocardiogram  performed on 01/17/2014 which showed an ejection fraction of 45% and findings consistent with left ventricular diastolic dysfunction. She also has a history of atrial fibrillation, on anticoagulation with pradaxa. She was recently discharged from the medicine service on 01/20/2014 at which time she was evaluated by cardiology having her digoxin was discontinued. There was concern about the high risk of toxicity due to age and renal dysfunction. She presents to the emergency room with complaints of progressive shortness of breath over the past week, associated with weight gain and bilateral lower extremity edema. She has associated cough with white sputum production, however denies fevers, chills, chest pain, nausea vomiting, diarrhea. Family members present at bedside reporting that she has had low blood pressures over the past several weeks for which they have not given metoprolol on a daily basis. Per discharge summary on 01/20/2014 she was discharged on Metoprolol 37.5 mg by mouth twice a day. In the emergency room he was found to be in A. fib with RVR presenting with ventricular rates in the 120s. He was administered IV labetalol after which heart rates improved however she became hypotensive with systolic blood pressures in the 80s. Case was discussed with cardiology who will consult.                                                                                             Review of Systems:  She with history of dementia, difficult to obtain a reliable review of systems  Past Medical History  Diagnosis Date  . Hypothyroid  1989  . Hypertension   . A-fib     For about 5 years  . Hard of hearing   . Family history of anesthesia complication     /daughter, Gwen "had a reaction, welps to something they gave me"  . High cholesterol   . Heart murmur   . Pneumonia 01/16/2014    "a little on one side; never had it before"   . Anemia   . History of blood transfusion     "w/hysterectomy"  . H/O hiatal hernia   . GERD (gastroesophageal reflux disease)   . Stroke     "they think she had a mini stroke 2 days ago" (01/18/2014)  . Arthritis     "joints; all over" (01/18/2014)  . Anxiety   . Breast cancer     "right breast"  . Dementia     "don't know stage or type" (01/18/2014)   Past Surgical History  Procedure Laterality Date  . Abdominal hysterectomy    . Carpal tunnel release Right   . Cataract extraction w/ intraocular lens  implant, bilateral Bilateral   . Breast biopsy Right   . Breast lumpectomy Right    Social History:  reports that she has never smoked. She has  never used smokeless tobacco. She reports that she does not drink alcohol or use illicit drugs.  No Known Allergies  No family history on file.   Prior to Admission medications   Medication Sig Start Date End Date Taking? Authorizing Provider  acetaminophen (TYLENOL) 500 MG tablet Take 1,000 mg by mouth every 6 (six) hours as needed for mild pain.    Historical Provider, MD  allopurinol (ZYLOPRIM) 100 MG tablet Take 100 mg by mouth daily.    Historical Provider, MD  azithromycin (ZITHROMAX) 500 MG tablet Take 1 tablet (500 mg total) by mouth daily. X 5 days 01/20/14   Ripudeep Krystal Eaton, MD  beta carotene w/minerals (OCUVITE) tablet Take 1 tablet by mouth daily.    Historical Provider, MD  cefUROXime (CEFTIN) 500 MG tablet Take 1 tablet (500 mg total) by mouth 2 (two) times daily with a meal. X 5days 01/20/14   Ripudeep Krystal Eaton, MD  dabigatran (PRADAXA) 75 MG CAPS capsule Take 75 mg by mouth 2 (two) times daily.    Historical Provider, MD  ferrous  gluconate (FERGON) 324 MG tablet Take 1 tablet (324 mg total) by mouth 2 (two) times daily with a meal. 01/20/14   Ripudeep K Rai, MD  haloperidol (HALDOL) 0.5 MG tablet Take 0.5 mg by mouth daily.    Historical Provider, MD  levothyroxine (SYNTHROID, LEVOTHROID) 175 MCG tablet Take 175 mcg by mouth daily before breakfast.    Historical Provider, MD  metoprolol tartrate (LOPRESSOR) 12.5 mg TABS tablet Take 1.5 tablets (37.5 mg total) by mouth 2 (two) times daily. 01/20/14   Ripudeep Krystal Eaton, MD  Polyethyl Glycol-Propyl Glycol (SYSTANE OP) Place 1 drop into both eyes daily.    Historical Provider, MD  simvastatin (ZOCOR) 20 MG tablet Take 20 mg by mouth daily.    Historical Provider, MD   Physical Exam: Filed Vitals:   02/14/14 1230  BP: 92/66  Pulse: 111  Temp:   Resp: 28    BP 92/66  Pulse 111  Temp(Src) 97.8 F (36.6 C) (Oral)  Resp 28  SpO2 98%  General:  Appears calm and comfortable, no acute distress. She is confused and disoriented. Her hearing. Eyes: PERRL, normal lids, irises & conjunctiva ENT: grossly normal hearing, lips & tongue Neck: Positive jugular venous distention, neck is symmetrical, supple Cardiovascular: Irregular rate and rhythm, no m/r/g. 2+ bilateral extremity pitting edema Telemetry: A. fib   Respiratory: Normal respiratory effort, does not appear to be in respiratory distress. She has fine crackles to bases bilaterally, no wheezing rhonchi or rales present. Abdomen: soft, ntnd Skin: no rash or induration seen on limited exam Musculoskeletal: 2+ bilateral extremity pitting edema Psychiatric: Patient is confused, disoriented, however pleasant and in no acute distress. Neurologic: grossly non-focal.          Labs on Admission:  Basic Metabolic Panel:  Recent Labs Lab 02/14/14 0930  NA 135*  K 4.7  CL 97  CO2 26  GLUCOSE 186*  BUN 27*  CREATININE 1.10  CALCIUM 9.9   Liver Function Tests:  Recent Labs Lab 02/14/14 0930  AST 22  ALT 13    ALKPHOS 110  BILITOT 0.3  PROT 7.5  ALBUMIN 2.4*   No results found for this basename: LIPASE, AMYLASE,  in the last 168 hours No results found for this basename: AMMONIA,  in the last 168 hours CBC:  Recent Labs Lab 02/14/14 0930  WBC 7.0  NEUTROABS 5.5  HGB 11.4*  HCT 36.8  MCV  98.1  PLT 386   Cardiac Enzymes: No results found for this basename: CKTOTAL, CKMB, CKMBINDEX, TROPONINI,  in the last 168 hours  BNP (last 3 results)  Recent Labs  02/14/14 0930  PROBNP 21821.0*   CBG: No results found for this basename: GLUCAP,  in the last 168 hours  Radiological Exams on Admission: Dg Chest Port 1 View  02/14/2014   CLINICAL DATA:  Shortness of breath  EXAM: PORTABLE CHEST - 1 VIEW  COMPARISON:  DG CHEST 1V PORT dated 01/16/2014  FINDINGS: There are bilateral interstitial and alveolar airspace opacities. There are bilateral small pleural effusions, right greater than left. There is no pneumothorax. There is stable cardiomegaly. The osseous structures are unremarkable. There are surgical clips in the left axilla from prior axillary dissection.  IMPRESSION: Bilateral interstitial and alveolar airspace opacities with bilateral small pleural effusions concerning for pulmonary edema. Alternatively multi lobar pneumonia can have a similar appearance.   Electronically Signed   By: Kathreen Devoid   On: 02/14/2014 10:30    EKG: Independently reviewed. A. fib  Assessment/Plan Active Problems:   Acute on chronic systolic heart failure   A-fib   Dyspnea   Renal insufficiency   Dementia with behavioral disturbance   CHF (congestive heart failure)   1. Acute on chronic combined systolic and diastolic congestive heart failure. She with history of CHF having her last transthoracic echocardiogram performed on 01/17/2014 that showed an ejection fraction of 45% with findings suggestive of left ventricular diastolic dysfunction. She presents with clinical signs and symptoms suggestive of CHF.  I suspect A. fib with RVR precipitated decompensated congestive heart failure. Patient was administered 40 mg of IV Lasix in the emergency room. Cardiology consultation has been placed. She became hypotensive in the emergency room after the administration of diuretic therapy and labetalol.  2. Atrial fibrillation with rapid ventricular response. Patient presenting with ventricular rates in the 120s. She was discharged on beta blocker therapy with metoprolol 37.5 mg twice a day on a recent hospitalization. Family members reporting that they didn't give her metoprolol daily as prescribed due to hypotension. Digoxin was discontinued recently due to concerns for toxicity given advanced age and renal function. She was given IV labetalol in the emergency room with subsequent improvement in heart rates. Cardiology has been consulted. Will admit to telemetry. 3. Hypothyroidism. Having a history of hypothyroidism and on thyroid replacement therapy, now presenting with Afib with RVR will check a TSH. Meanwhile will continue her home regimen. 4. Chronic anticoagulation. Patient Pradaxa therapy for for A. fib.  5. History of dementia with behavioral disturbance. Continue her 0.5 mg of haloperidol 6. Dyslipidemia. Continue statin therapy 7. DVT prophylaxis. Patient anticoagulated    Code Status: DNR Family Communication: Spoke with family present at bedside, confirmed she is a DNR Disposition Plan: I anticipate she will require greater than 2 nights hospitalizaiton  Time spent: 70  min  [facility] Pager [phone]  **Disclaimer: This note may have been dictated with voice recognition software. Similar sounding words can inadvertently be transcribed and this note may contain transcription errors which may not have been corrected upon publication of note.**

## 2014-02-14 NOTE — Progress Notes (Signed)
[facility]  Patient Status: Active (receiving services up to time of hospitalization)  [facility] is providing the following services: RN and PT  If patient discharges after hours, please call [phone].   Janae Sauce 02/14/2014, 4:05 PM

## 2014-02-14 NOTE — ED Notes (Signed)
[place], MD at bedside.  

## 2014-02-14 NOTE — ED Provider Notes (Signed)
CSN: [id number]     Arrival date & time 02/14/14  7846 History   First MD Initiated Contact with Patient 02/14/14 0920     Chief Complaint  Patient presents with  . Shortness of Breath     (Consider location/radiation/quality/duration/timing/severity/associated sxs/prior Treatment) HPI This patient with dementia presents with concerns of dyspnea. 05 caveat secondary to dementia.  On initial arrival the patient only complains of dementia, denies pain, though she is oriented only to self.  Subsequently discussed the patient's presentation with her daughter.  Patient's daughter states the patient has had palpitations, though no pain in the last day. Patient recently was discharged after adjustment of medication, including cessation of digoxin, and reduction of metoprolol.  Past Medical History  Diagnosis Date  . Hypothyroid 1989  . Hypertension   . A-fib     For about 5 years  . Hard of hearing   . Family history of anesthesia complication     /daughter, Gwen "had a reaction, welps to something they gave me"  . High cholesterol   . Heart murmur   . Pneumonia 01/16/2014    "a little on one side; never had it before"   . Anemia   . History of blood transfusion     "w/hysterectomy"  . H/O hiatal hernia   . GERD (gastroesophageal reflux disease)   . Stroke     "they think she had a mini stroke 2 days ago" (01/18/2014)  . Arthritis     "joints; all over" (01/18/2014)  . Anxiety   . Breast cancer     "right breast"  . Dementia     "don't know stage or type" (01/18/2014)   Past Surgical History  Procedure Laterality Date  . Abdominal hysterectomy    . Carpal tunnel release Right   . Cataract extraction w/ intraocular lens  implant, bilateral Bilateral   . Breast biopsy Right   . Breast lumpectomy Right    No family history on file. History  Substance Use Topics  . Smoking status: Never Smoker   . Smokeless tobacco: Never Used  . Alcohol Use: No   OB History   Grav  Para Term Preterm Abortions TAB SAB Ect Mult Living                 Review of Systems  Unable to perform ROS: Dementia      Allergies  Review of patient's allergies indicates no known allergies.  Home Medications   Prior to Admission medications   Medication Sig Start Date End Date Taking? Authorizing Provider  acetaminophen (TYLENOL) 500 MG tablet Take 1,000 mg by mouth every 6 (six) hours as needed for mild pain.    Historical Provider, MD  allopurinol (ZYLOPRIM) 100 MG tablet Take 100 mg by mouth daily.    Historical Provider, MD  azithromycin (ZITHROMAX) 500 MG tablet Take 1 tablet (500 mg total) by mouth daily. X 5 days 01/20/14   Ripudeep Krystal Eaton, MD  beta carotene w/minerals (OCUVITE) tablet Take 1 tablet by mouth daily.    Historical Provider, MD  cefUROXime (CEFTIN) 500 MG tablet Take 1 tablet (500 mg total) by mouth 2 (two) times daily with a meal. X 5days 01/20/14   Ripudeep Krystal Eaton, MD  dabigatran (PRADAXA) 75 MG CAPS capsule Take 75 mg by mouth 2 (two) times daily.    Historical Provider, MD  ferrous gluconate (FERGON) 324 MG tablet Take 1 tablet (324 mg total) by mouth 2 (two) times daily with a  meal. 01/20/14   Ripudeep Krystal Eaton, MD  haloperidol (HALDOL) 0.5 MG tablet Take 0.5 mg by mouth daily.    Historical Provider, MD  levothyroxine (SYNTHROID, LEVOTHROID) 175 MCG tablet Take 175 mcg by mouth daily before breakfast.    Historical Provider, MD  metoprolol tartrate (LOPRESSOR) 12.5 mg TABS tablet Take 1.5 tablets (37.5 mg total) by mouth 2 (two) times daily. 01/20/14   Ripudeep Krystal Eaton, MD  Polyethyl Glycol-Propyl Glycol (SYSTANE OP) Place 1 drop into both eyes daily.    Historical Provider, MD  simvastatin (ZOCOR) 20 MG tablet Take 20 mg by mouth daily.    Historical Provider, MD   Pulse 120  Resp 22  SpO2 96% Physical Exam  Nursing note and vitals reviewed. Constitutional: She is oriented to person, place, and time. She appears well-developed and well-nourished. No distress.   HENT:  Head: Normocephalic and atraumatic.  Eyes: Conjunctivae and EOM are normal.  Cardiovascular: Regular rhythm.  Tachycardia present.   Pulmonary/Chest: No stridor. Tachypnea noted. No respiratory distress. She has decreased breath sounds. She has wheezes.  Abdominal: She exhibits no distension.  Musculoskeletal: She exhibits no edema.  Neurological: She is alert and oriented to person, place, and time. No cranial nerve deficit.  Skin: Skin is warm and dry.  Psychiatric: She has a normal mood and affect.    ED Course  Procedures (including critical care time) Labs Review Labs Reviewed  CBC WITH DIFFERENTIAL - Abnormal; Notable for the following:    RBC 3.75 (*)    Hemoglobin 11.4 (*)    RDW 16.4 (*)    Neutrophils Relative % 78 (*)    All other components within normal limits  I-STAT CG4 LACTIC ACID, ED - Abnormal; Notable for the following:    Lactic Acid, Venous 2.25 (*)    All other components within normal limits  CULTURE, BLOOD (ROUTINE X 2)  CULTURE, BLOOD (ROUTINE X 2)  URINE CULTURE  COMPREHENSIVE METABOLIC PANEL  URINALYSIS, ROUTINE W REFLEX MICROSCOPIC  PRO B NATRIURETIC PEPTIDE  DIGOXIN LEVEL    Imaging Review Dg Chest Port 1 View  02/14/2014   CLINICAL DATA:  Shortness of breath  EXAM: PORTABLE CHEST - 1 VIEW  COMPARISON:  DG CHEST 1V PORT dated 01/16/2014  FINDINGS: There are bilateral interstitial and alveolar airspace opacities. There are bilateral small pleural effusions, right greater than left. There is no pneumothorax. There is stable cardiomegaly. The osseous structures are unremarkable. There are surgical clips in the left axilla from prior axillary dissection.  IMPRESSION: Bilateral interstitial and alveolar airspace opacities with bilateral small pleural effusions concerning for pulmonary edema. Alternatively multi lobar pneumonia can have a similar appearance.   Electronically Signed   By: Kathreen Devoid   On: 02/14/2014 10:30     EKG  Interpretation   Date/Time:  Wednesday Feb 14 2014 09:25:28 EDT Ventricular Rate:  116 PR Interval:  199 QRS Duration: 108 QT Interval:  305 QTC Calculation: 424 R Axis:   -71 Text Interpretation:  Sinus tachycardia Multiform ventricular premature  complexes Abnormal R-wave progression, late transition Inferior infarct,  acute Baseline wander in lead(s) II V2 Sinus tachycardia Artifact Abnormal  ekg Confirmed by Carmin Muskrat  MD [id number]) on 02/14/2014 9:32:36 AM     On a monitor the patient has irregular rhythm, rate 120s, abnormal Pulse ox which is 100% with nasal cannula abnormal I reviewed the patient's chart after the initial evaluation.   11:14 AM On exam the patient is smiling.  Patient's  daughter she stated that she has been inconsistently taking beta blocker secondary to concerns of tachycardia. Patient's evaluation demonstrates elevated BNP, and on repeat exam she is tachycardic, with a narrow topics to give her rhythm. Patient will receive both oral and IV blocker, Lasix, antibiotics. MDM   This patient presents with dyspnea.  The patient's dementia limits history somewhat, but on exam patient is awake alert.  She is tachycardic, hypoxic.  Patient's presentation is most consistent with heart failure, possibly secondary to persistent tachycardia.  However, there remains some concern for concurrent pneumonia.  Patient was started on therapy, admitted for further evaluation and management.    Carmin Muskrat, MD 02/14/14 1115

## 2014-02-14 NOTE — ED Notes (Signed)
Sob and wheezing since this am per family has some edema in lower legss 156/107 changed her bp meds recently and bp has been up recent hx of pneu

## 2014-02-15 DIAGNOSIS — R5383 Other fatigue: Secondary | ICD-10-CM

## 2014-02-15 DIAGNOSIS — R5381 Other malaise: Secondary | ICD-10-CM

## 2014-02-15 DIAGNOSIS — R7989 Other specified abnormal findings of blood chemistry: Secondary | ICD-10-CM

## 2014-02-15 DIAGNOSIS — J189 Pneumonia, unspecified organism: Secondary | ICD-10-CM

## 2014-02-15 DIAGNOSIS — R000 Tachycardia, unspecified: Secondary | ICD-10-CM

## 2014-02-15 DIAGNOSIS — R799 Abnormal finding of blood chemistry, unspecified: Secondary | ICD-10-CM

## 2014-02-15 DIAGNOSIS — D649 Anemia, unspecified: Secondary | ICD-10-CM

## 2014-02-15 LAB — BASIC METABOLIC PANEL
BUN: 29 mg/dL — ABNORMAL HIGH (ref 6–23)
CHLORIDE: 95 meq/L — AB (ref 96–112)
CO2: 24 mEq/L (ref 19–32)
Calcium: 9.7 mg/dL (ref 8.4–10.5)
Creatinine, Ser: 1.12 mg/dL — ABNORMAL HIGH (ref 0.50–1.10)
GFR calc Af Amer: 48 mL/min — ABNORMAL LOW (ref >90)
GFR, EST NON AFRICAN AMERICAN: 42 mL/min — AB (ref >90)
Glucose, Bld: 132 mg/dL — ABNORMAL HIGH (ref 70–99)
Potassium: 4.7 mEq/L (ref 3.7–5.3)
SODIUM: 132 meq/L — AB (ref 137–147)

## 2014-02-15 LAB — URINE CULTURE
COLONY COUNT: NO GROWTH
Culture: NO GROWTH

## 2014-02-15 LAB — CBC
HEMATOCRIT: 35.6 % — AB (ref 36.0–46.0)
Hemoglobin: 10.8 g/dL — ABNORMAL LOW (ref 12.0–15.0)
MCH: 29.7 pg (ref 26.0–34.0)
MCHC: 30.3 g/dL (ref 30.0–36.0)
MCV: 97.8 fL (ref 78.0–100.0)
Platelets: 403 10*3/uL — ABNORMAL HIGH (ref 150–400)
RBC: 3.64 MIL/uL — AB (ref 3.87–5.11)
RDW: 16.2 % — ABNORMAL HIGH (ref 11.5–15.5)
WBC: 5.5 10*3/uL (ref 4.0–10.5)

## 2014-02-15 LAB — TROPONIN I

## 2014-02-15 MED ORDER — ACETAMINOPHEN 325 MG PO TABS
650.0000 mg | ORAL_TABLET | Freq: Four times a day (QID) | ORAL | Status: DC | PRN
  Administered 2014-02-15: 650 mg via ORAL
  Filled 2014-02-15: qty 2

## 2014-02-15 MED ORDER — HALOPERIDOL 0.5 MG PO TABS
0.5000 mg | ORAL_TABLET | Freq: Every evening | ORAL | Status: DC | PRN
  Filled 2014-02-15: qty 1

## 2014-02-15 MED ORDER — DIAZEPAM 2 MG PO TABS
1.0000 mg | ORAL_TABLET | Freq: Two times a day (BID) | ORAL | Status: DC | PRN
  Administered 2014-02-15: 1 mg via ORAL
  Filled 2014-02-15: qty 1

## 2014-02-15 MED ORDER — FERROUS GLUCONATE 324 (38 FE) MG PO TABS
324.0000 mg | ORAL_TABLET | Freq: Two times a day (BID) | ORAL | Status: DC
  Administered 2014-02-15 – 2014-02-19 (×8): 324 mg via ORAL
  Filled 2014-02-15 (×11): qty 1

## 2014-02-15 MED ORDER — FUROSEMIDE 10 MG/ML IJ SOLN
INTRAMUSCULAR | Status: AC
  Administered 2014-02-15: 20 mg via INTRAVENOUS
  Filled 2014-02-15: qty 4

## 2014-02-15 MED ORDER — ENSURE PUDDING PO PUDG
1.0000 | ORAL | Status: DC
  Administered 2014-02-15 – 2014-02-20 (×5): 1 via ORAL

## 2014-02-15 MED ORDER — LEVOTHYROXINE SODIUM 200 MCG PO TABS
200.0000 ug | ORAL_TABLET | Freq: Every day | ORAL | Status: DC
  Administered 2014-02-16 – 2014-02-21 (×5): 200 ug via ORAL
  Filled 2014-02-15 (×7): qty 1

## 2014-02-15 MED ORDER — ENSURE COMPLETE PO LIQD
237.0000 mL | Freq: Two times a day (BID) | ORAL | Status: DC
  Administered 2014-02-15 – 2014-02-21 (×8): 237 mL via ORAL

## 2014-02-15 MED ORDER — FUROSEMIDE 10 MG/ML IJ SOLN
20.0000 mg | Freq: Two times a day (BID) | INTRAMUSCULAR | Status: DC
  Administered 2014-02-15 – 2014-02-16 (×3): 20 mg via INTRAVENOUS
  Filled 2014-02-15 (×3): qty 2

## 2014-02-15 MED ORDER — CARVEDILOL 6.25 MG PO TABS
6.2500 mg | ORAL_TABLET | Freq: Two times a day (BID) | ORAL | Status: DC
  Administered 2014-02-15 – 2014-02-16 (×2): 6.25 mg via ORAL
  Filled 2014-02-15 (×4): qty 1

## 2014-02-15 NOTE — Plan of Care (Signed)
Problem: Phase I Progression Outcomes Goal: EF % per last Echo/documented,Core Reminder form on chart Outcome: Completed/Met Date Met:  02/15/14 EF 45% per ECHO performed on 01/17/14.

## 2014-02-15 NOTE — Progress Notes (Signed)
Patient Name: Hannah Manning Date of Encounter: 02/15/2014     Active Problems:   Renal insufficiency   A-fib   Dementia with behavioral disturbance   Dyspnea   Acute on chronic systolic heart failure   CHF (congestive heart failure)    SUBJECTIVE  Appears mildly agitated, pulling off telemetry leads and 02. She didn't sleep at all last night and was calling out names of family members, some that have passed. She does not have any CP or SOB although daughters think she is a little SOB. She has no complaints, but is hard of hearing and moderately demented.     CURRENT MEDS . beta carotene w/minerals  1 tablet Oral Daily  . carvedilol  3.125 mg Oral BID WC  . dabigatran  75 mg Oral Q12H  . digoxin  0.0625 mg Oral Daily  . feeding supplement (ENSURE COMPLETE)  237 mL Oral BID BM  . feeding supplement (ENSURE)  1 Container Oral Q24H  . ferrous gluconate  324 mg Oral BID WC  . furosemide  20 mg Intravenous Q12H  . haloperidol  0.5 mg Oral Daily  . [START ON 02/16/2014] levothyroxine  200 mcg Oral QAC breakfast  . simvastatin  20 mg Oral q1800  . sodium chloride  3 mL Intravenous Q12H    OBJECTIVE  Filed Vitals:   02/14/14 1718 02/14/14 2028 02/15/14 0547 02/15/14 1209  BP:  118/85 121/91 96/69  Pulse: 114 118 116 116  Temp:  97.9 F (36.6 C) 98.2 F (36.8 C)   TempSrc:  Oral Oral   Resp:  18 17   Height:      Weight:   160 lb 7.9 oz (72.8 kg)   SpO2:  98% 91% 95%    Intake/Output Summary (Last 24 hours) at 02/15/14 1242 Last data filed at 02/15/14 1042  Gross per 24 hour  Intake    720 ml  Output      0 ml  Net    720 ml   Filed Weights   02/14/14 1349 02/15/14 0547  Weight: 164 lb 14.4 oz (74.798 kg) 160 lb 7.9 oz (72.8 kg)    PHYSICAL EXAM  General: Pleasant, NAD. Mildly confused. Able to answer questions. Hard to keep on topic.  Neuro: Alert and oriented X 3. Moves all extremities spontaneously. Psych: mildly agitated, trying to pull of  telemetry leads and 02 HEENT:  Normal  Neck: Supple without bruits or +JVD. Lungs:  Resp regular and unlabored, CTA. Heart: irreg irreg, tachy Abdomen: Soft, non-tender, non-distended, BS + x 4.  Extremities: 1+ pitting edema bilaterally, R>L  Accessory Clinical Findings  CBC  Recent Labs  02/14/14 0930 02/14/14 1426 02/15/14 0105  WBC 7.0 6.2 5.5  NEUTROABS 5.5  --   --   HGB 11.4* 10.1* 10.8*  HCT 36.8 33.5* 35.6*  MCV 98.1 100.0 97.8  PLT 386 360 326*   Basic Metabolic Panel  Recent Labs  02/14/14 0930 02/14/14 1426 02/15/14 0105  NA 135*  --  132*  K 4.7  --  4.7  CL 97  --  95*  CO2 26  --  24  GLUCOSE 186*  --  132*  BUN 27*  --  29*  CREATININE 1.10 1.05 1.12*  CALCIUM 9.9  --  9.7  MG  --  1.9  --    Liver Function Tests  Recent Labs  02/14/14 0930  AST 22  ALT 13  ALKPHOS 110  BILITOT 0.3  PROT 7.5  ALBUMIN 2.4*    Cardiac Enzymes  Recent Labs  02/14/14 1824 02/15/14 0105  TROPONINI 0.31* <0.30    Thyroid Function Tests  Recent Labs  02/14/14 1426  TSH 10.930*    TELE  afib with RVR HR 110   Radiology/Studies  Dg Chest Port 1 View  02/14/2014   CLINICAL DATA:  Shortness of breath  EXAM: PORTABLE CHEST - 1 VIEW  COMPARISON:  DG CHEST 1V PORT dated 01/16/2014  FINDINGS: There are bilateral interstitial and alveolar airspace opacities. There are bilateral small pleural effusions, right greater than left. There is no pneumothorax. There is stable cardiomegaly. The osseous structures are unremarkable. There are surgical clips in the left axilla from prior axillary dissection.  IMPRESSION: Bilateral interstitial and alveolar airspace opacities with bilateral small pleural effusions concerning for pulmonary edema. Alternatively multi lobar pneumonia can have a similar appearance.     Dg Chest Port 1 View  01/16/2014   CLINICAL DATA:  Weakness and hypertension  EXAM: PORTABLE CHEST - 1 VIEW  COMPARISON:  03/05/2005  FINDINGS: Cardiac  enlargement.  Negative for heart failure  Mild left lower lobe airspace disease and small left effusion. Possible pneumonia.  Surgical clips in the left axilla and left breast.  IMPRESSION: Left lower lobe airspace disease and small left effusion, question pneumonia     Latest ECHO: 12/2013  - Left ventricle: Small cavity with severe LVH. Decreased thickening of inferior septum. EF is in the 45% range. Findings consistent with left ventricular diastolic dysfunction. - Aortic valve: Moderately severe calcification with mild aortic stenosis. Mean gradient: 23mm Hg (S). Peak gradient: 71mm Hg (S). - Left atrium: The atrium was moderately to severely dilated. - Right ventricle: The cavity size was mildly dilated. Systolic function was moderately reduced. - Right atrium: The atrium was mildly dilated. - Pulmonary arteries: PA peak pressure: 59mm Hg (S).    ASSESSMENT AND PLAN Hannah Manning is a 80 y.o. female with a history of HTN, combined systolic + diastolic HF (EF of 57% on echo 01/17/14), mild AS and atrial fibrillation on Pradaxa and recent hospitalization 01/16/14-01/20/14 for PNA and new onset afib who presented to [facility] yesterday complaining of progressive SOB associated weight gain and bilateral LEE. In the ED he was found to be in A. fib with RVR. HR 120s. She was administered IV labetalol with improvement in HR, however she became hypotensive with SBP in the 80s.   Acute on chronic combined systolic and diastolic CHF- TTE 84/69/62 with EF 45% with findings suggestive of diastolic dysfunction. CXR with CHF vs PNA. BNP 21K -- On IV lasix 20mg  BID, creat bumped a little. BUN pretty stable. Continue on this dose. -- Weight down 4lbs, but +768ml -- Trop POC 0.31 during ongoing A.Fib episode, neg troponin I x3. No CP -- Continue BB, no ACE as she is hypotensive  Atrial fibrillation with RVR. Spontaneously converted to sinus tach -- Recently discharged on metoprolol 37.5 mg BID.  However not routinely receiving this medication at home due to hypotension.  -- In the ED she was started on digoxin and carvedilol. Her HR is improved but still elevated ~110 Now in sinus. BP low at 96/69.   -- Discontinue digoxin with risk of toxicity in this elderly female.  -- Recommend D/Cing Pradaxa given age and anemia.  -- Would increase carvedilol from 3.125 to 6.25 mg  CKD: stable to slightly raising in Cr due to ongoing diuresis.  -- Follow renal function, BUN  stable for now  Hypothyroidism- TSH is 10.930; IM adjusting synthroid   History of dementia with behavioral disturbance. Continue her 0.5 mg of haloperidol. She takes diazepam at home for agitation. Will re order this PRN   Dyslipidemia. Continue statin therapy   Signed, Perry Mount PA-C  Pager [phone]   Agree with note by Lorretta Harp PA-C  Pt admitted for CHD. She has mod LVD, PAF now SR/ST. Elevated BNP. She appears to have an element of dementia. Lives alone with assistance. She is moderately hypotensive and has not significantly diuresed. I'm not sure that she is capable of independent living and suspect that she would be better off in an assisted care setting. Rec slow titration of BB, and diuretic. I don't think she's a candidate for oral anticoagulants. Call if we can be of additional assistance.  Lorretta Harp, M.D., [place], [facility], Laverta [place] [address] [place], [place]  [id number]  [phone] 02/15/2014 2:05 PM

## 2014-02-15 NOTE — Progress Notes (Signed)
UR completed Shaquisha Wynn K. Ramez Arrona, RN, BSN, MSHL, CCM  02/15/2014 1:31 PM

## 2014-02-15 NOTE — Progress Notes (Signed)
INITIAL NUTRITION ASSESSMENT  DOCUMENTATION CODES Per approved criteria  -Not Applicable   INTERVENTION: Provide Ensure Complete BID after meals Provide Ensure Pudding once daily Provide Multivitamin with minerals daily Encourage PO intake  NUTRITION DIAGNOSIS: Inadequate oral intake related to poor appetite as evidenced by meal intake 5-30%. Goal: Pt to meet >/= 90% of their estimated nutrition needs   Monitor:  PO intake, weight trend, labs  Reason for Assessment: Low Braden  80 y.o. female  Admitting Dx: <principal problem not specified>  ASSESSMENT: 80 y.o. female with a past medical history of by systolic and diastolic congestive heart failure having her last transthoracic echocardiogram performed on 01/17/2014 which showed an ejection fraction of 45% and findings consistent with left ventricular diastolic dysfunction. She was recently discharged from the medicine service on 01/20/2014 at which time she was evaluated by cardiology having her digoxin was discontinued. There was concern about the high risk of toxicity due to age and renal dysfunction. She presents to the emergency room with complaints of progressive shortness of breath over the past week, associated with weight gain and bilateral lower extremity edema. She has associated cough with white sputum production, however denies fevers, chills, chest pain, nausea vomiting, diarrhea.  Pt has history of dementia and was unable to answer questions at time of visit. Per pt's daughter at bedside, pt has only been eating a few bites per meal. Pt has been drinking Ensure Plus at home. No weight loss in the past month per weight history. Pt with some moderate wasting in arms and temples. Per nursing notes, pt ate 5% of breakfast this morning and 25-30% of meals yesterday. Pt with stage 2 PU to left leg.   Height: Ht Readings from Last 1 Encounters:  02/14/14 5\' 5"  (1.651 m)    Weight: Wt Readings from Last 1 Encounters:   02/15/14 160 lb 7.9 oz (72.8 kg)    Ideal Body Weight: 125 lbs  % Ideal Body Weight: 128%  Wt Readings from Last 10 Encounters:  02/15/14 160 lb 7.9 oz (72.8 kg)  01/19/14 157 lb 4.8 oz (71.351 kg)    Usual Body Weight: unknown  % Usual Body Weight: NA  BMI:  Body mass index is 26.71 kg/(m^2).  Estimated Nutritional Needs: Kcal: 1700-1900 Protein: 80-90 grams Fluid: 1.7-1.9 L/day  Skin: +3 RLE and LLE edema; stage 2 pressure ulcer on left leg  Diet Order: Cardiac  EDUCATION NEEDS: -No education needs identified at this time   Intake/Output Summary (Last 24 hours) at 02/15/14 1110 Last data filed at 02/15/14 1042  Gross per 24 hour  Intake    720 ml  Output     15 ml  Net    705 ml    Last BM: 5/20  Labs:   Recent Labs Lab 02/14/14 0930 02/14/14 1426 02/15/14 0105  NA 135*  --  132*  K 4.7  --  4.7  CL 97  --  95*  CO2 26  --  24  BUN 27*  --  29*  CREATININE 1.10 1.05 1.12*  CALCIUM 9.9  --  9.7  MG  --  1.9  --   GLUCOSE 186*  --  132*    CBG (last 3)  No results found for this basename: GLUCAP,  in the last 72 hours  Scheduled Meds: . beta carotene w/minerals  1 tablet Oral Daily  . carvedilol  3.125 mg Oral BID WC  . dabigatran  75 mg Oral Q12H  . digoxin  0.0625 mg Oral Daily  . ferrous gluconate  324 mg Oral BID WC  . furosemide  20 mg Intravenous Q12H  . haloperidol  0.5 mg Oral Daily  . levothyroxine  175 mcg Oral QAC breakfast  . simvastatin  20 mg Oral q1800  . sodium chloride  3 mL Intravenous Q12H    Continuous Infusions:   Past Medical History  Diagnosis Date  . Hypothyroid 1989  . Hypertension   . A-fib     For about 5 years  . Hard of hearing   . Family history of anesthesia complication     /daughter, Gwen "had a reaction, welps to something they gave me"  . High cholesterol   . Heart murmur   . Pneumonia 01/16/2014    "a little on one side; never had it before"   . Anemia   . History of blood transfusion      "w/hysterectomy"  . H/O hiatal hernia   . GERD (gastroesophageal reflux disease)   . Stroke     "they think she had a mini stroke 2 days ago" (01/18/2014)  . Arthritis     "joints; all over" (01/18/2014)  . Anxiety   . Breast cancer     "right breast"  . Dementia     "don't know stage or type" (01/18/2014)  . CHF (congestive heart failure)   . Shortness of breath     Past Surgical History  Procedure Laterality Date  . Abdominal hysterectomy    . Carpal tunnel release Right   . Cataract extraction w/ intraocular lens  implant, bilateral Bilateral   . Breast biopsy Right   . Breast lumpectomy Right     Pryor Ochoa RD, LDN Inpatient Clinical Dietitian Pager: [phone] After Hours Pager: [phone]

## 2014-02-15 NOTE — Progress Notes (Signed)
[facility] PROGRESS NOTE Interim History: 80 y.o. female with a past medical history of by systolic and diastolic congestive heart failure having her last transthoracic echocardiogram performed on 01/17/2014 which showed an ejection fraction of 45% and findings consistent with left ventricular diastolic dysfunction. She also has a history of atrial fibrillation, on anticoagulation with pradaxa. She was recently discharged from the medicine service on 01/20/2014 at which time she was evaluated by cardiology having her digoxin was discontinued. There was concern about the high risk of toxicity due to age and renal dysfunction. She presents to the emergency room with complaints of progressive shortness of breath over the past week, associated with weight gain and bilateral lower extremity edema. She has associated cough with white sputum production, however denies fevers, chills, chest pain, nausea vomiting, diarrhea. Family members present at bedside reporting that she has had low blood pressures over the past several weeks for which they have not given metoprolol on a daily basis. Per discharge summary on 01/20/2014 she was discharged on Metoprolol 37.5 mg by mouth twice a day. In the emergency room he was found to be in A. fib with RVR presenting with ventricular rates in the 120s. He was administered IV labetalol after which heart rates improved however she became hypotensive with systolic blood pressures in the 80s. Case was discussed with cardiology who will consult.    Filed Weights   02/14/14 1349 02/15/14 0547  Weight: 74.798 kg (164 lb 14.4 oz) 72.8 kg (160 lb 7.9 oz)        Intake/Output Summary (Last 24 hours) at 02/15/14 1212 Last data filed at 02/15/14 1042  Gross per 24 hour  Intake    720 ml  Output      0 ml  Net    720 ml     Assessment/Plan: 1-Acute on chronic combined systolic and diastolic congestive heart failure. She with history of CHF having her last  transthoracic echocardiogram performed on 01/17/2014 that showed an ejection fraction of 45% with findings suggestive of left ventricular diastolic dysfunction.  -will continue daily weight, strict intake and output -IV lasix -low sodium diet -troponin essentially negative except for first one elevated at 0.31 during ongoing a. Fib episode -denies CP -oxygen supplementation as needed -still with signs of fluid overload -BMET daily -continue b-blocker  2-Atrial fibrillation with rapid ventricular response. Patient presenting with ventricular rates in the 120s. She was discharged on beta blocker therapy with metoprolol 37.5 mg twice a day on a recent hospitalization. Family members reporting that they didn't give her metoprolol daily as prescribed due to hypotension. -following cardiology rec's -will start Digoxin and use carvedilol -rate is better -if BP remains and issue will need amiodarone  3-chronic renal disease: stable to slightly raising in Cr due to ongoing diuresis. -follow renal function -adjust daily doses of lasix  4-Hypothyroidism. Having a history of hypothyroidism and on thyroid replacement therapy, now presenting with Afib with RVR. -TSH is 10.930 -will adjust synthroid  5-Chronic anticoagulation. Patient Pradaxa therapy for for A. fib.   6-History of dementia with behavioral disturbance. Continue her 0.5 mg of haloperidol   7-Dyslipidemia. Continue statin therapy  DVT prophylaxis: Patient anticoagulated with pradaxa.    Code Status: Full Family Communication: daughter at bedside Disposition Plan: to be determine.   Consultants:  Cardiology   Procedures: Latest ECHO: 12/2013 - Left ventricle: Small cavity with severe LVH. Decreased thickening of inferior septum. EF is in the 45% range. Findings consistent with left ventricular  diastolic dysfunction. - Aortic valve: Moderately severe calcification with mild aortic stenosis. Mean gradient: 60mm Hg (S).  Peak gradient: 32mm Hg (S). - Left atrium: The atrium was moderately to severely dilated. - Right ventricle: The cavity size was mildly dilated. Systolic function was moderately reduced. - Right atrium: The atrium was mildly dilated. - Pulmonary arteries: PA peak pressure: 37mm Hg (S).  Antibiotics:  None   HPI/Subjective: Breathing and feeling better. Family at bedside noticing improvement in her condition as well. HR is better controlled, BP stable. No fever.  Objective: Filed Vitals:   02/14/14 1718 02/14/14 2028 02/15/14 0547 02/15/14 1209  BP:  118/85 121/91 96/69  Pulse: 114 118 116 116  Temp:  97.9 F (36.6 C) 98.2 F (36.8 C)   TempSrc:  Oral Oral   Resp:  18 17   Height:      Weight:   72.8 kg (160 lb 7.9 oz)   SpO2:  98% 91% 95%     Exam:  General: Alert and awake, very hard of hearing; breathing better.  HEENT: No bruits, no goiter; mild JVD  Heart: irregular, positive SEM, no rubs or gallops; trace to 1+ edema bilaterally Lungs: decrease air movement at bases and fine crackels Abdomen: Soft, nontender, nondistended, positive bowel sounds.  Neuro: Grossly intact, nonfocal.   Data Reviewed: Basic Metabolic Panel:  Recent Labs Lab 02/14/14 0930 02/14/14 1426 02/15/14 0105  NA 135*  --  132*  K 4.7  --  4.7  CL 97  --  95*  CO2 26  --  24  GLUCOSE 186*  --  132*  BUN 27*  --  29*  CREATININE 1.10 1.05 1.12*  CALCIUM 9.9  --  9.7  MG  --  1.9  --    Liver Function Tests:  Recent Labs Lab 02/14/14 0930  AST 22  ALT 13  ALKPHOS 110  BILITOT 0.3  PROT 7.5  ALBUMIN 2.4*   CBC:  Recent Labs Lab 02/14/14 0930 02/14/14 1426 02/15/14 0105  WBC 7.0 6.2 5.5  NEUTROABS 5.5  --   --   HGB 11.4* 10.1* 10.8*  HCT 36.8 33.5* 35.6*  MCV 98.1 100.0 97.8  PLT 386 360 403*   Cardiac Enzymes:  Recent Labs Lab 02/14/14 1824 02/15/14 0105  TROPONINI 0.31* <0.30   BNP (last 3 results)  Recent Labs  02/14/14 0930  PROBNP 21821.0*     Recent Results (from the past 240 hour(s))  CULTURE, BLOOD (ROUTINE X 2)     Status: None   Collection Time    02/14/14 10:22 AM      Result Value Ref Range Status   Specimen Description BLOOD LEFT ANTECUBITAL   Final   Special Requests BOTTLES DRAWN AEROBIC AND ANAEROBIC 10CC   Final   Culture  Setup Time     Final   Value: 02/14/2014 14:32     Performed at [facility]   Culture     Final   Value:        BLOOD CULTURE RECEIVED NO GROWTH TO DATE CULTURE WILL BE HELD FOR 5 DAYS BEFORE ISSUING A FINAL NEGATIVE REPORT     Performed at [facility]   Report Status PENDING   Incomplete  CULTURE, BLOOD (ROUTINE X 2)     Status: None   Collection Time    02/14/14 10:30 AM      Result Value Ref Range Status   Specimen Description BLOOD HAND LEFT   Final  Special Requests BOTTLES DRAWN AEROBIC ONLY 10CC   Final   Culture  Setup Time     Final   Value: 02/14/2014 14:33     Performed at [facility]   Culture     Final   Value:        BLOOD CULTURE RECEIVED NO GROWTH TO DATE CULTURE WILL BE HELD FOR 5 DAYS BEFORE ISSUING A FINAL NEGATIVE REPORT     Performed at [facility]   Report Status PENDING   Incomplete     Studies: Dg Chest Port 1 View  02/14/2014   CLINICAL DATA:  Shortness of breath  EXAM: PORTABLE CHEST - 1 VIEW  COMPARISON:  DG CHEST 1V PORT dated 01/16/2014  FINDINGS: There are bilateral interstitial and alveolar airspace opacities. There are bilateral small pleural effusions, right greater than left. There is no pneumothorax. There is stable cardiomegaly. The osseous structures are unremarkable. There are surgical clips in the left axilla from prior axillary dissection.  IMPRESSION: Bilateral interstitial and alveolar airspace opacities with bilateral small pleural effusions concerning for pulmonary edema. Alternatively multi lobar pneumonia can have a similar appearance.   Electronically Signed   By: Kathreen Devoid   On: 02/14/2014 10:30     Scheduled Meds: . beta carotene w/minerals  1 tablet Oral Daily  . carvedilol  3.125 mg Oral BID WC  . dabigatran  75 mg Oral Q12H  . digoxin  0.0625 mg Oral Daily  . feeding supplement (ENSURE COMPLETE)  237 mL Oral BID BM  . feeding supplement (ENSURE)  1 Container Oral Q24H  . ferrous gluconate  324 mg Oral BID WC  . furosemide  20 mg Intravenous Q12H  . haloperidol  0.5 mg Oral Daily  . levothyroxine  175 mcg Oral QAC breakfast  . simvastatin  20 mg Oral q1800  . sodium chloride  3 mL Intravenous Q12H   Continuous Infusions:   Time > 30 minutes  Barton Dubois  [facility] Pager [phone]. If 8PM-8AM, please contact night-coverage at [email or web address], password [facility] 02/15/2014, 12:12 PM  LOS: 1 day

## 2014-02-15 NOTE — Care Management Note (Addendum)
  Page 2 of 2   02/21/2014     4:27:09 PM CARE MANAGEMENT NOTE 02/21/2014  Patient:  Hannah Manning, Hannah Manning   Account Number:  [id number]  Date Initiated:  02/15/2014  Documentation initiated by:  Dystany Duffy  Subjective/Objective Assessment:   Admitted with CHF, Afib     Action/Plan:   CM to follow for disposition needs   Anticipated DC Date:  02/18/2014   Anticipated DC Plan:  HOME W HOSPICE CARE  In-house referral  Clinical Social Worker      DC Forensic scientist  CM consult      PAC Choice  DURABLE MEDICAL EQUIPMENT  HOSPICE   Choice offered to / List presented to:  C-4 Adult Children   DME arranged  OXYGEN  OVERBED TABLE      DME agency  [facility] arranged  HH-1 RN  HH-4 NURSE'S AIDE      Status of service:  Completed, signed off Medicare Important Message given?  YES (If response is "NO", the following Medicare IM given date fields will be blank) Date Medicare IM given:  02/14/2014 Date Additional Medicare IM given:    Discharge Disposition:  [place]  Per UR Regulation:  Reviewed for med. necessity/level of care/duration of stay  If discussed at [place] of Stay Meetings, dates discussed:    Comments:  Doyt Castellana RN, BSN, MSHL, CCM  Nurse - Case Manager, ([facility] [place])  [phone]  02/21/2014 Oxygen delivery to home completed.  DTR Mariann Laster called CM to confirm. Dispostion:  Home with [place]. Transported to home via ambulance transport.  Liller Yohn RN, BSN, MSHL, CCM  Nurse - Case Manager, (Unit [phone]  02/20/2014 Social:  From home alone. PCG/ DTR/Wanda [phone] provides supplemental care with joint efforts of her sister/Gwen who lives in [place] and brother, Herbie [place].  Family plan to discuss d/c plan tonight by phone. Charity fundraiser / Private Pay 3 hours / day M-F. PT RECS:  SNF but family elects home with [place] post Palliative GOC  meeting and discussion with MD. Home DME:  cane, walker, BSC, Hospital Bed, lightweight transport chair DME needed at d/c:  OBT for home use, home oxygen - CM contacted DTR/Wanda at work today.  CM provided education on d/c plan and Hospice Services. CM notified [facility] of d/c plan; advised DTR request call from Santiago Glad re appt time to discuss Hospice services. CM notified Santiago Glad that DTR/Wanda will also be available for home delivery arrangements for home oxygen.  Santiago Glad to discuss plan with Mariann Laster. Hospice to see patient today. Disposition Plan:  Home with [place]. SW/Donna Crowder notified patient will need ambulance transport at time of d/c.

## 2014-02-16 LAB — BASIC METABOLIC PANEL
BUN: 34 mg/dL — AB (ref 6–23)
CHLORIDE: 95 meq/L — AB (ref 96–112)
CO2: 26 meq/L (ref 19–32)
Calcium: 9.6 mg/dL (ref 8.4–10.5)
Creatinine, Ser: 1.36 mg/dL — ABNORMAL HIGH (ref 0.50–1.10)
GFR calc Af Amer: 38 mL/min — ABNORMAL LOW (ref >90)
GFR, EST NON AFRICAN AMERICAN: 33 mL/min — AB (ref >90)
GLUCOSE: 115 mg/dL — AB (ref 70–99)
POTASSIUM: 4.9 meq/L (ref 3.7–5.3)
SODIUM: 130 meq/L — AB (ref 137–147)

## 2014-02-16 MED ORDER — SODIUM CHLORIDE 0.9 % IV SOLN
250.0000 mL | Freq: Once | INTRAVENOUS | Status: AC
  Administered 2014-02-16: 250 mL via INTRAVENOUS

## 2014-02-16 MED ORDER — CARVEDILOL 3.125 MG PO TABS
3.1250 mg | ORAL_TABLET | Freq: Two times a day (BID) | ORAL | Status: DC
  Administered 2014-02-17 – 2014-02-19 (×5): 3.125 mg via ORAL
  Filled 2014-02-16 (×7): qty 1

## 2014-02-16 MED ORDER — HALOPERIDOL LACTATE 5 MG/ML IJ SOLN: 0.5000 mg | Freq: Three times a day (TID) | INTRAMUSCULAR | Status: DC | PRN

## 2014-02-16 NOTE — Progress Notes (Signed)
Chart review complete.  Patient is not eligible for [facility] Care Management services because his/her PCP is not a [facility] primary care provider or is not [facility] affiliated.  For any additional questions or new referrals please contact Tim Henderson BSN RN [facility] Hospital Liaison at [phone] °

## 2014-02-16 NOTE — Progress Notes (Signed)
[facility] PROGRESS NOTE Interim History: 80 y.o. female with a past medical history of by systolic and diastolic congestive heart failure having her last transthoracic echocardiogram performed on 01/17/2014 which showed an ejection fraction of 45% and findings consistent with left ventricular diastolic dysfunction. She also has a history of atrial fibrillation, on anticoagulation with pradaxa. She was recently discharged from the medicine service on 01/20/2014 at which time she was evaluated by cardiology having her digoxin was discontinued. There was concern about the high risk of toxicity due to age and renal dysfunction. She presents to the emergency room with complaints of progressive shortness of breath over the past week, associated with weight gain and bilateral lower extremity edema. She has associated cough with white sputum production, however denies fevers, chills, chest pain, nausea vomiting, diarrhea. Family members present at bedside reporting that she has had low blood pressures over the past several weeks for which they have not given metoprolol on a daily basis. Per discharge summary on 01/20/2014 she was discharged on Metoprolol 37.5 mg by mouth twice a day. In the emergency room he was found to be in A. fib with RVR presenting with ventricular rates in the 120s. He was administered IV labetalol after which heart rates improved however she became hypotensive with systolic blood pressures in the 80s. Case was discussed with cardiology who will consult.    Filed Weights   02/14/14 1349 02/15/14 0547 02/16/14 0700  Weight: 74.798 kg (164 lb 14.4 oz) 72.8 kg (160 lb 7.9 oz) 74.4 kg (164 lb 0.4 oz)       No intake or output data in the 24 hours ending 02/16/14 1516   Assessment/Plan: 1-Acute on chronic combined systolic and diastolic congestive heart failure. She with history of CHF having her last transthoracic echocardiogram performed on 01/17/2014 that showed an ejection  fraction of 45% with findings suggestive of left ventricular diastolic dysfunction.  -will continue daily weight, strict intake and output -IV lasix on hold due to hypotension and worsening renal function; will give gentle VI bolus and if stable will resume PO lasix -low sodium diet -troponin essentially negative except for first one elevated I-stat 0.31 during ongoing a. Fib episode -denies CP -continue oxygen supplementation as needed -still with mild Le edema; but breathing improved and no crackles -BMET daily -continue b-blocker; but dose will be adjusted to 3.125 and hold today  2-Atrial fibrillation with rapid ventricular response. Patient presenting with ventricular rates in the 120s. She was discharged on beta blocker therapy with metoprolol 37.5 mg twice a day on a recent hospitalization. Family members reporting that they didn't give her metoprolol daily as prescribed due to hypotension. -following cardiology rec's -will start Digoxin and use carvedilol -rate is better -if BP remains and issue will need amiodarone  3-acute on chronic renal disease: due to diuresis and hypotension. -will hold lasix and reduce coreg to avoid hypotension -if tolerated in am will resume low dose PO lasix  4-Hypothyroidism. Having a history of hypothyroidism and on thyroid replacement therapy, now presenting with Afib with RVR. -TSH is 10.930 -will adjust synthroid to 285mcg daily  5-Chronic anticoagulation. Patient on Pradaxa therapy for for A. fib.   6-History of dementia with behavioral disturbance. Continue PRN haldol  7-Dyslipidemia. Continue statin therapy  DVT prophylaxis: Patient anticoagulated with pradaxa.    Code Status: Full Family Communication: daughter at bedside Disposition Plan: to be determine.   Consultants:  Cardiology   Procedures: Latest ECHO: 12/2013 - Left ventricle: Small  cavity with severe LVH. Decreased thickening of inferior septum. EF is in the 45%  range. Findings consistent with left ventricular diastolic dysfunction. - Aortic valve: Moderately severe calcification with mild aortic stenosis. Mean gradient: 45mm Hg (S). Peak gradient: 66mm Hg (S). - Left atrium: The atrium was moderately to severely dilated. - Right ventricle: The cavity size was mildly dilated. Systolic function was moderately reduced. - Right atrium: The atrium was mildly dilated. - Pulmonary arteries: PA peak pressure: 30mm Hg (S).  Antibiotics:  None   HPI/Subjective: Breathing is improved; but patient is today very lethargic and feeling weak  Objective: Filed Vitals:   02/15/14 2008 02/16/14 0700 02/16/14 1359 02/16/14 1407  BP: 132/63 119/90 81/53 82/58   Pulse: 115 116 113   Temp: 97.4 F (36.3 C) 97.1 F (36.2 C) 98.1 F (36.7 C)   TempSrc: Axillary Axillary Axillary   Resp:  20 20   Height:      Weight:  74.4 kg (164 lb 0.4 oz)    SpO2: 92% 99% 100%      Exam:  General: BP is very soft today; patient is easily arouse, but lethargic and feeling too weak HEENT: No bruits, no goiter; no JVD  Heart: irregular, positive SEM, no rubs or gallops; trace to 1+ edema bilaterally Lungs: improved air movement, no frank crackles Abdomen: Soft, nontender, nondistended, positive bowel sounds.  Neuro: nonfocal; patient lethargic due to soft BP.   Data Reviewed: Basic Metabolic Panel:  Recent Labs Lab 02/14/14 0930 02/14/14 1426 02/15/14 0105 02/16/14 0633  NA 135*  --  132* 130*  K 4.7  --  4.7 4.9  CL 97  --  95* 95*  CO2 26  --  24 26  GLUCOSE 186*  --  132* 115*  BUN 27*  --  29* 34*  CREATININE 1.10 1.05 1.12* 1.36*  CALCIUM 9.9  --  9.7 9.6  MG  --  1.9  --   --    Liver Function Tests:  Recent Labs Lab 02/14/14 0930  AST 22  ALT 13  ALKPHOS 110  BILITOT 0.3  PROT 7.5  ALBUMIN 2.4*   CBC:  Recent Labs Lab 02/14/14 0930 02/14/14 1426 02/15/14 0105  WBC 7.0 6.2 5.5  NEUTROABS 5.5  --   --   HGB 11.4* 10.1* 10.8*   HCT 36.8 33.5* 35.6*  MCV 98.1 100.0 97.8  PLT 386 360 403*   Cardiac Enzymes:  Recent Labs Lab 02/14/14 1824 02/15/14 0105  TROPONINI 0.31* <0.30   BNP (last 3 results)  Recent Labs  02/14/14 0930  PROBNP 21821.0*    Recent Results (from the past 240 hour(s))  CULTURE, BLOOD (ROUTINE X 2)     Status: None   Collection Time    02/14/14 10:22 AM      Result Value Ref Range Status   Specimen Description BLOOD LEFT ANTECUBITAL   Final   Special Requests BOTTLES DRAWN AEROBIC AND ANAEROBIC 10CC   Final   Culture  Setup Time     Final   Value: 02/14/2014 14:32     Performed at [facility]   Culture     Final   Value:        BLOOD CULTURE RECEIVED NO GROWTH TO DATE CULTURE WILL BE HELD FOR 5 DAYS BEFORE ISSUING A FINAL NEGATIVE REPORT     Performed at [facility]   Report Status PENDING   Incomplete  CULTURE, BLOOD (ROUTINE X 2)  Status: None   Collection Time    02/14/14 10:30 AM      Result Value Ref Range Status   Specimen Description BLOOD HAND LEFT   Final   Special Requests BOTTLES DRAWN AEROBIC ONLY 10CC   Final   Culture  Setup Time     Final   Value: 02/14/2014 14:33     Performed at [facility]   Culture     Final   Value:        BLOOD CULTURE RECEIVED NO GROWTH TO DATE CULTURE WILL BE HELD FOR 5 DAYS BEFORE ISSUING A FINAL NEGATIVE REPORT     Performed at [facility]   Report Status PENDING   Incomplete  URINE CULTURE     Status: None   Collection Time    02/14/14 12:03 PM      Result Value Ref Range Status   Specimen Description URINE, CATHETERIZED   Final   Special Requests NONE   Final   Culture  Setup Time     Final   Value: 02/14/2014 17:59     Performed at [facility]     Final   Value: NO GROWTH     Performed at [facility]   Culture     Final   Value: NO GROWTH     Performed at [facility]   Report Status 02/15/2014 FINAL   Final     Studies: No results  found.  Scheduled Meds: . sodium chloride  250 mL Intravenous Once  . beta carotene w/minerals  1 tablet Oral Daily  . [START ON 02/17/2014] carvedilol  3.125 mg Oral BID WC  . feeding supplement (ENSURE COMPLETE)  237 mL Oral BID BM  . feeding supplement (ENSURE)  1 Container Oral Q24H  . ferrous gluconate  324 mg Oral BID WC  . levothyroxine  200 mcg Oral QAC breakfast  . simvastatin  20 mg Oral q1800  . sodium chloride  3 mL Intravenous Q12H   Continuous Infusions:   Time > 30 minutes  Barton Dubois  [facility] Pager [phone]. If 8PM-8AM, please contact night-coverage at [email or web address], password [facility] 02/16/2014, 3:16 PM  LOS: 2 days

## 2014-02-16 NOTE — Progress Notes (Signed)
PT Cancellation Note  Patient Details Name: Hannah Manning MRN: [id number] DOB: 06/21/1921   Cancelled Treatment:    Reason Eval/Treat Not Completed: Medical issues which prohibited therapy;Fatigue/lethargy limiting ability to participate (low BP).  I spent time talking with daughter from [place].  Pt was having [facility] PT and SN prior to admit.  Pt was walking with RW some and using wheelchair some. Pt had just gotten new hospital bed and having easier time with bed mobilty  Pt has 24/7 care - paid daytime caregiver and local daughter in evenings.  Today pt lethargic, not waking with MD eval on her.  Will perform PT eval when pt more alert.  Nursing and MD aware and agree.   Loyal Buba 02/16/2014, 2:38 PM 02/16/2014   Rande Lawman, PT

## 2014-02-16 NOTE — Progress Notes (Signed)
Pt's BP 82/52 manually, MD notified, orders given.  Will carry out orders and continue to monitor.

## 2014-02-16 NOTE — Progress Notes (Signed)
Pt BP 84/60 manually after receiving 250 bolus.  MD text/paged, will continue to monitor.

## 2014-02-17 LAB — BASIC METABOLIC PANEL
BUN: 39 mg/dL — AB (ref 6–23)
CHLORIDE: 95 meq/L — AB (ref 96–112)
CO2: 25 meq/L (ref 19–32)
Calcium: 9.4 mg/dL (ref 8.4–10.5)
Creatinine, Ser: 1.47 mg/dL — ABNORMAL HIGH (ref 0.50–1.10)
GFR calc Af Amer: 35 mL/min — ABNORMAL LOW (ref >90)
GFR calc non Af Amer: 30 mL/min — ABNORMAL LOW (ref >90)
Glucose, Bld: 81 mg/dL (ref 70–99)
POTASSIUM: 5 meq/L (ref 3.7–5.3)
Sodium: 130 mEq/L — ABNORMAL LOW (ref 137–147)

## 2014-02-17 MED ORDER — HYDROCORTISONE 10 MG PO TABS
10.0000 mg | ORAL_TABLET | Freq: Every day | ORAL | Status: DC
  Administered 2014-02-17 – 2014-02-19 (×3): 10 mg via ORAL
  Filled 2014-02-17 (×3): qty 1

## 2014-02-17 MED ORDER — MIDODRINE HCL 5 MG PO TABS
5.0000 mg | ORAL_TABLET | Freq: Three times a day (TID) | ORAL | Status: DC
  Administered 2014-02-17 – 2014-02-19 (×6): 5 mg via ORAL
  Filled 2014-02-17 (×9): qty 1

## 2014-02-17 MED ORDER — FUROSEMIDE 20 MG PO TABS
20.0000 mg | ORAL_TABLET | Freq: Every day | ORAL | Status: DC
  Administered 2014-02-20 – 2014-02-21 (×2): 20 mg via ORAL
  Filled 2014-02-17 (×5): qty 1

## 2014-02-17 NOTE — Progress Notes (Signed)
Physical Therapy Evaluation Patient Details Name: Hannah Manning MRN: [id number] DOB: 1921/01/09 Today's Date: 02/17/2014   History of Present Illness  Patient is a 80 yo female admitted 02/14/14 with dyspnea, CHF exacerbation, Afib with RVR.  Patient with h/o dementia, CHF, Afib, HTN, HOH, CVA.  Clinical Impression  Patient presents with problems listed below.  Will benefit from acute PT to maximize independence prior to discharge.  Recommend SNF at discharge for continued therapy.  Daughter states she will keep this in mind as she talks with her siblings, and for the [place] meeting.  If family declines SNF, will need to continue to provide 24 hour physical assist for patient - they have all equipment needed.    Follow Up Recommendations SNF;Supervision/Assistance - 24 hour    Equipment Recommendations  None recommended by PT    Recommendations for Other Services       Precautions / Restrictions Precautions Precautions: Fall Restrictions Weight Bearing Restrictions: No      Mobility  Bed Mobility Overal bed mobility: Needs Assistance;+2 for physical assistance Bed Mobility: Supine to Sit;Sit to Supine     Supine to sit: Mod assist;+2 for physical assistance Sit to supine: Mod assist;+2 for physical assistance   General bed mobility comments: Verbal and tactile cues for technique.  Assist to maneuver LE's off of bed and raise trunk to sitting position.  Once upright, patient able to maintain balance with min guard assist.  Patient sat EOB x 10 minutes.  Required +2 assist to return to supine and scoot up to [facility].  Transfers                    Ambulation/Gait                Stairs            Wheelchair Mobility    Modified Rankin (Stroke Patients Only)       Balance Overall balance assessment: Needs assistance Sitting-balance support: No upper extremity supported;Feet supported Sitting balance-Leahy Scale: Fair                                        Pertinent Vitals/Pain     Home Living Family/patient expects to be discharged to:: Private residence Living Arrangements: Alone Available Help at Discharge: Personal care attendant;Family;Available 24 hours/day Type of Home: House Home Access: Stairs to enter Entrance Stairs-Rails: Right Entrance Stairs-Number of Steps: 3 Home Layout: One level Home Equipment: Walker - 2 wheels;Bedside commode;Shower seat;Wheelchair - manual;Hospital bed Additional Comments: Daughter reports patient has aide during day and family provide evening/night assist.  Daughter also reports they are working on a ramp.    Prior Function Level of Independence: Needs assistance   Gait / Transfers Assistance Needed: Assist to ambulate short distances with RW.  Majority of time in wheelchair.  ADL's / Homemaking Assistance Needed: Assist with all ADL's, meal prep, and housekeeping.        Hand Dominance        Extremity/Trunk Assessment   Upper Extremity Assessment: Generalized weakness           Lower Extremity Assessment: Generalized weakness (Significant edema noted BLE's)         Communication   Communication: HOH  Cognition Arousal/Alertness: Lethargic;Awake/alert (Lethargic initially.  Awake and alert in sitting.) Behavior During Therapy: [facility] for tasks assessed/performed (Smiling and laughing, especially when not understanding PT)  Overall Cognitive Status: History of cognitive impairments - at baseline       Memory: Decreased short-term memory              General Comments      Exercises        Assessment/Plan    PT Assessment Patient needs continued PT services  PT Diagnosis Difficulty walking;Generalized weakness;Altered mental status   PT Problem List Decreased strength;Decreased activity tolerance;Decreased balance;Decreased mobility;Decreased cognition;Decreased safety awareness;Cardiopulmonary status limiting activity  PT Treatment  Interventions DME instruction;Gait training;Functional mobility training;Therapeutic exercise;Balance training;Patient/family education   PT Goals (Current goals can be found in the Care Plan section) Acute Rehab PT Goals Patient Stated Goal: Unable to state PT Goal Formulation: With family Time For Goal Achievement: 03/03/14 Potential to Achieve Goals: Fair    Frequency Min 3X/week   Barriers to discharge        Co-evaluation               End of Session Equipment Utilized During Treatment: Oxygen Activity Tolerance: Patient limited by fatigue Patient left: in bed;with call bell/phone within reach;with bed alarm set;with family/visitor present Nurse Communication: Mobility status         Time: 1849-1916 PT Time Calculation (min): 27 min   Charges:   PT Evaluation $Initial PT Evaluation Tier I: 1 Procedure PT Treatments $Therapeutic Activity: 8-22 mins   PT G Codes:          Despina Pole 02/19/2014, 7:35 PM Carita Pian. Sanjuana Kava, [place] Pager [phone]

## 2014-02-17 NOTE — Progress Notes (Signed)
[facility] PROGRESS NOTE Interim History: 80 y.o. female with a past medical history of by systolic and diastolic congestive heart failure having her last transthoracic echocardiogram performed on 01/17/2014 which showed an ejection fraction of 45% and findings consistent with left ventricular diastolic dysfunction. She also has a history of atrial fibrillation, on anticoagulation with pradaxa. She was recently discharged from the medicine service on 01/20/2014 at which time she was evaluated by cardiology having her digoxin was discontinued. There was concern about the high risk of toxicity due to age and renal dysfunction. She presents to the emergency room with complaints of progressive shortness of breath over the past week, associated with weight gain and bilateral lower extremity edema. She has associated cough with white sputum production, however denies fevers, chills, chest pain, nausea vomiting, diarrhea. Family members present at bedside reporting that she has had low blood pressures over the past several weeks for which they have not given metoprolol on a daily basis. Per discharge summary on 01/20/2014 she was discharged on Metoprolol 37.5 mg by mouth twice a day. In the emergency room he was found to be in A. fib with RVR presenting with ventricular rates in the 120s. He was administered IV labetalol after which heart rates improved however she became hypotensive with systolic blood pressures in the 80s. Case was discussed with cardiology who will consult.    Filed Weights   02/15/14 0547 02/16/14 0700 02/17/14 0543  Weight: 72.8 kg (160 lb 7.9 oz) 74.4 kg (164 lb 0.4 oz) 73.4 kg (161 lb 13.1 oz)        Intake/Output Summary (Last 24 hours) at 02/17/14 1722 Last data filed at 02/17/14 1600  Gross per 24 hour  Intake    540 ml  Output      0 ml  Net    540 ml     Assessment/Plan: 1-Acute on chronic combined systolic and diastolic congestive heart failure. She with  history of CHF having her last transthoracic echocardiogram performed on 01/17/2014 that showed an ejection fraction of 45% with findings suggestive of left ventricular diastolic dysfunction.  -will continue daily weight, strict intake and output -IV lasix on hold due to hypotension and worsening renal function -continue low sodium diet -troponin essentially negative except for first one elevated I-stat 0.31 during initial ongoing a. Fib episode -denies CP -continue oxygen supplementation as needed -still with mild Le edema; but breathing improved and no crackles on exam -BMET daily -continue b-blocker; but dose will be adjusted to 3.125 and holding parameters for SBP < 95 -after discussing with family limitations and vicious cycle situation; they will likely to talk with palliative care group and set up advance directives and options for symptomatic treatment.  2-Atrial fibrillation with rapid ventricular response. Patient presenting with ventricular rates in the 120s. She was discharged on beta blocker therapy with metoprolol 37.5 mg twice a day on a recent hospitalization. Family members reporting that they didn't give her metoprolol daily as prescribed due to hypotension. -following cardiology rec's -will continue Digoxin and use carvedilol (very low dose) -rate is slightly elevated; but BP limiting further tx. -if BP remains and issue will need amiodarone  3-acute on chronic renal disease: due to diuresis and hypotension. -will hold lasix and reduce coreg to avoid hypotension -once BP stable will resume low dose PO lasix  4-Hypothyroidism. Having a history of hypothyroidism and on thyroid replacement therapy, now presenting with Afib with RVR. -TSH is 10.930 -will continue synthroid (now adjusted to  262mcg daily)  5-Chronic anticoagulation. Patient on Pradaxa therapy for A. fib.   6-History of dementia with behavioral disturbance. Continue PRN haldol only  7-Dyslipidemia. Continue  statin therapy  8-acute urinary retention: will place foley  DVT prophylaxis: Patient anticoagulated with pradaxa.    Code Status: Full Family Communication: daughter at bedside Disposition Plan: to be determine.   Consultants:  Cardiology   Palliative care  Procedures: Latest ECHO: 12/2013 - Left ventricle: Small cavity with severe LVH. Decreased thickening of inferior septum. EF is in the 45% range. Findings consistent with left ventricular diastolic dysfunction. - Aortic valve: Moderately severe calcification with mild aortic stenosis. Mean gradient: 54mm Hg (S). Peak gradient: 56mm Hg (S). - Left atrium: The atrium was moderately to severely dilated. - Right ventricle: The cavity size was mildly dilated. Systolic function was moderately reduced. - Right atrium: The atrium was mildly dilated. - Pulmonary arteries: PA peak pressure: 33mm Hg (S).  Antibiotics:  None   HPI/Subjective: BP is still soft and limiting treatment for A. Fib and CHF. Patient also with acute urinary retention (624ml in bladder scan)  Objective: Filed Vitals:   02/16/14 2100 02/17/14 0543 02/17/14 1100 02/17/14 1428  BP: 88/68 86/66 86/50  88/49  Pulse: 115 110    Temp: 98 F (36.7 C) 98 F (36.7 C)  97.2 F (36.2 C)  TempSrc: Axillary Oral  Axillary  Resp: 20 18  20   Height:      Weight:  73.4 kg (161 lb 13.1 oz)    SpO2: 100% 100%       Exam:  General: BP continue to be soft; patient is Awake and oriented to person only; afebrile and report she is feeling better. HEENT: No bruits, no goiter; no JVD  Heart: irregular, positive SEM, no rubs or gallops; 1++ edema bilaterally Lungs: improved air movement, no frank crackles Abdomen: Soft, nontender, nondistended, positive bowel sounds.  Neuro: nonfocal; patient more alert today   Data Reviewed: Basic Metabolic Panel:  Recent Labs Lab 02/14/14 0930 02/14/14 1426 02/15/14 0105 02/16/14 0633 02/17/14 0536  NA 135*  --   132* 130* 130*  K 4.7  --  4.7 4.9 5.0  CL 97  --  95* 95* 95*  CO2 26  --  24 26 25   GLUCOSE 186*  --  132* 115* 81  BUN 27*  --  29* 34* 39*  CREATININE 1.10 1.05 1.12* 1.36* 1.47*  CALCIUM 9.9  --  9.7 9.6 9.4  MG  --  1.9  --   --   --    Liver Function Tests:  Recent Labs Lab 02/14/14 0930  AST 22  ALT 13  ALKPHOS 110  BILITOT 0.3  PROT 7.5  ALBUMIN 2.4*   CBC:  Recent Labs Lab 02/14/14 0930 02/14/14 1426 02/15/14 0105  WBC 7.0 6.2 5.5  NEUTROABS 5.5  --   --   HGB 11.4* 10.1* 10.8*  HCT 36.8 33.5* 35.6*  MCV 98.1 100.0 97.8  PLT 386 360 403*   Cardiac Enzymes:  Recent Labs Lab 02/14/14 1824 02/15/14 0105  TROPONINI 0.31* <0.30   BNP (last 3 results)  Recent Labs  02/14/14 0930  PROBNP 21821.0*    Recent Results (from the past 240 hour(s))  CULTURE, BLOOD (ROUTINE X 2)     Status: None   Collection Time    02/14/14 10:22 AM      Result Value Ref Range Status   Specimen Description BLOOD LEFT ANTECUBITAL   Final  Special Requests BOTTLES DRAWN AEROBIC AND ANAEROBIC 10CC   Final   Culture  Setup Time     Final   Value: 02/14/2014 14:32     Performed at [facility]   Culture     Final   Value:        BLOOD CULTURE RECEIVED NO GROWTH TO DATE CULTURE WILL BE HELD FOR 5 DAYS BEFORE ISSUING A FINAL NEGATIVE REPORT     Performed at [facility]   Report Status PENDING   Incomplete  CULTURE, BLOOD (ROUTINE X 2)     Status: None   Collection Time    02/14/14 10:30 AM      Result Value Ref Range Status   Specimen Description BLOOD HAND LEFT   Final   Special Requests BOTTLES DRAWN AEROBIC ONLY 10CC   Final   Culture  Setup Time     Final   Value: 02/14/2014 14:33     Performed at [facility]   Culture     Final   Value:        BLOOD CULTURE RECEIVED NO GROWTH TO DATE CULTURE WILL BE HELD FOR 5 DAYS BEFORE ISSUING A FINAL NEGATIVE REPORT     Performed at [facility]   Report Status PENDING   Incomplete  URINE  CULTURE     Status: None   Collection Time    02/14/14 12:03 PM      Result Value Ref Range Status   Specimen Description URINE, CATHETERIZED   Final   Special Requests NONE   Final   Culture  Setup Time     Final   Value: 02/14/2014 17:59     Performed at [facility]     Final   Value: NO GROWTH     Performed at [facility]   Culture     Final   Value: NO GROWTH     Performed at [facility]   Report Status 02/15/2014 FINAL   Final     Studies: No results found.  Scheduled Meds: . beta carotene w/minerals  1 tablet Oral Daily  . carvedilol  3.125 mg Oral BID WC  . feeding supplement (ENSURE COMPLETE)  237 mL Oral BID BM  . feeding supplement (ENSURE)  1 Container Oral Q24H  . ferrous gluconate  324 mg Oral BID WC  . furosemide  20 mg Oral Daily  . hydrocortisone  10 mg Oral Daily  . levothyroxine  200 mcg Oral QAC breakfast  . midodrine  5 mg Oral TID WC  . simvastatin  20 mg Oral q1800  . sodium chloride  3 mL Intravenous Q12H   Continuous Infusions:   Time > 30 minutes  Barton Dubois  [facility] Pager [phone]. If 8PM-8AM, please contact night-coverage at [email or web address], password [facility] 02/17/2014, 5:22 PM  LOS: 3 days

## 2014-02-17 NOTE — Progress Notes (Signed)
Pt has not voided all shift. Bladder scan reveals more than 600cc's urine. Dr Dyann Kief made aware. 40 French foley catheter inserted as ordered with good returns

## 2014-02-18 NOTE — Progress Notes (Signed)
[facility] PROGRESS NOTE Interim History: 80 y.o. female with a past medical history of by systolic and diastolic congestive heart failure having her last transthoracic echocardiogram performed on 01/17/2014 which showed an ejection fraction of 45% and findings consistent with left ventricular diastolic dysfunction. She also has a history of atrial fibrillation, on anticoagulation with pradaxa. She was recently discharged from the medicine service on 01/20/2014 at which time she was evaluated by cardiology having her digoxin was discontinued. There was concern about the high risk of toxicity due to age and renal dysfunction. She presents to the emergency room with complaints of progressive shortness of breath over the past week, associated with weight gain and bilateral lower extremity edema. She has associated cough with white sputum production, however denies fevers, chills, chest pain, nausea vomiting, diarrhea. Family members present at bedside reporting that she has had low blood pressures over the past several weeks for which they have not given metoprolol on a daily basis. Per discharge summary on 01/20/2014 she was discharged on Metoprolol 37.5 mg by mouth twice a day. In the emergency room he was found to be in A. fib with RVR presenting with ventricular rates in the 120s. He was administered IV labetalol after which heart rates improved however she became hypotensive with systolic blood pressures in the 80s. Waiting results of goal of care meeting   [facility] Weights   02/16/14 0700 02/17/14 0543 02/18/14 0405  Weight: 74.4 kg (164 lb 0.4 oz) 73.4 kg (161 lb 13.1 oz) 75.297 kg (166 lb)        Intake/Output Summary (Last 24 hours) at 02/18/14 1723 Last data filed at 02/18/14 0700  Gross per 24 hour  Intake    540 ml  Output    900 ml  Net   -360 ml     Assessment/Plan: 1-Acute on chronic combined systolic and diastolic congestive heart failure. She with history of CHF having her  last transthoracic echocardiogram performed on 01/17/2014 that showed an ejection fraction of 45% with findings suggestive of left ventricular diastolic dysfunction.  -will continue daily weight, strict intake and output -continue low sodium diet -troponin essentially negative except for first one elevated I-stat 0.31 during initial ongoing a. Fib episode -denies CP -continue oxygen supplementation as needed -still with mild LE edema; but breathing improved and no crackles on exam -BMET in am -continue b-blocker; but dose will be adjusted to 3.125 and holding parameters for SBP < 95 -after discussing with family limitations and vicious cycle situation; they will likely to talk with palliative care group and set up advance directives and options for symptomatic treatment.  2-Atrial fibrillation with rapid ventricular response. Patient presenting with ventricular rates in the 120s. She was discharged on beta blocker therapy with metoprolol 37.5 mg twice a day on a recent hospitalization. Family members reporting that they didn't give her metoprolol daily as prescribed due to hypotension. -following cardiology rec's -will continue Digoxin and use carvedilol (very low dose) -rate is slightly elevated; but BP limiting further tx. -if BP remains and issue will need amiodarone  3-acute on chronic renal disease: due to diuresis and hypotension. -will hold lasix and reduce coreg to avoid hypotension -once BP stable will resume low dose PO lasix  4-Hypothyroidism. Having a history of hypothyroidism and on thyroid replacement therapy, now presenting with Afib with RVR. -TSH is 10.930 -will continue synthroid (now adjusted to 241mcg daily)  5-Chronic anticoagulation. Patient on Pradaxa therapy for A. fib.   6-History of dementia  with behavioral disturbance. Continue PRN haldol only  7-Dyslipidemia. Continue statin therapy for now  8-acute urinary retention: will keep foley in place  9-pressure  ulcers: stage 1 -overlay mattress provided -continue preventing measures (constant rotation)  10-hypotension: will continue cortef and midrodine  DVT prophylaxis: Patient anticoagulated with pradaxa.    Code Status: Full Family Communication: daughter at bedside Disposition Plan: to be determine.   Consultants:  Cardiology   Palliative care  Procedures: Latest ECHO: 12/2013 - Left ventricle: Small cavity with severe LVH. Decreased thickening of inferior septum. EF is in the 45% range. Findings consistent with left ventricular diastolic dysfunction. - Aortic valve: Moderately severe calcification with mild aortic stenosis. Mean gradient: 60mm Hg (S). Peak gradient: 9mm Hg (S). - Left atrium: The atrium was moderately to severely dilated. - Right ventricle: The cavity size was mildly dilated. Systolic function was moderately reduced. - Right atrium: The atrium was mildly dilated. - Pulmonary arteries: PA peak pressure: 65 mm Hg (S).  Antibiotics:  None   HPI/Subjective: BP has remained soft; but stable with the use of solucortef and midodrine.   Objective: Filed Vitals:   02/18/14 0405 02/18/14 0620 02/18/14 1100 02/18/14 1356  BP:  94/68 80/50 93/75   Pulse:  60  113  Temp:  97.5 F (36.4 C)  97.3 F (36.3 C)  TempSrc:  Axillary  Axillary  Resp:  18  18  Height:      Weight: 75.297 kg (166 lb)     SpO2:  100%  98%     Exam: General: BP continue to be soft; patient is Awake and oriented to person only; remains very weak and frail HEENT: No bruits, no goiter; no JVD  Heart: irregular, positive SEM, no rubs or gallops; 1++ edema bilaterally Lungs: improved air movement, no frank crackles Abdomen: Soft, nontender, nondistended, positive bowel sounds.  Neuro: nonfocal; patient more alert today   Data Reviewed: Basic Metabolic Panel:  Recent Labs Lab 02/14/14 0930 02/14/14 1426 02/15/14 0105 02/16/14 0633 02/17/14 0536  NA 135*  --  132* 130* 130*   K 4.7  --  4.7 4.9 5.0  CL 97  --  95* 95* 95*  CO2 26  --  24 26 25   GLUCOSE 186*  --  132* 115* 81  BUN 27*  --  29* 34* 39*  CREATININE 1.10 1.05 1.12* 1.36* 1.47*  CALCIUM 9.9  --  9.7 9.6 9.4  MG  --  1.9  --   --   --    Liver Function Tests:  Recent Labs Lab 02/14/14 0930  AST 22  ALT 13  ALKPHOS 110  BILITOT 0.3  PROT 7.5  ALBUMIN 2.4*   CBC:  Recent Labs Lab 02/14/14 0930 02/14/14 1426 02/15/14 0105  WBC 7.0 6.2 5.5  NEUTROABS 5.5  --   --   HGB 11.4* 10.1* 10.8*  HCT 36.8 33.5* 35.6*  MCV 98.1 100.0 97.8  PLT 386 360 403*   Cardiac Enzymes:  Recent Labs Lab 02/14/14 1824 02/15/14 0105  TROPONINI 0.31* <0.30   BNP (last 3 results)  Recent Labs  02/14/14 0930  PROBNP 21821.0*    Recent Results (from the past 240 hour(s))  CULTURE, BLOOD (ROUTINE X 2)     Status: None   Collection Time    02/14/14 10:22 AM      Result Value Ref Range Status   Specimen Description BLOOD LEFT ANTECUBITAL   Final   Special Requests BOTTLES DRAWN AEROBIC AND ANAEROBIC  10CC   Final   Culture  Setup Time     Final   Value: 02/14/2014 14:32     Performed at [facility]   Culture     Final   Value:        BLOOD CULTURE RECEIVED NO GROWTH TO DATE CULTURE WILL BE HELD FOR 5 DAYS BEFORE ISSUING A FINAL NEGATIVE REPORT     Performed at [facility]   Report Status PENDING   Incomplete  CULTURE, BLOOD (ROUTINE X 2)     Status: None   Collection Time    02/14/14 10:30 AM      Result Value Ref Range Status   Specimen Description BLOOD HAND LEFT   Final   Special Requests BOTTLES DRAWN AEROBIC ONLY 10CC   Final   Culture  Setup Time     Final   Value: 02/14/2014 14:33     Performed at [facility]   Culture     Final   Value:        BLOOD CULTURE RECEIVED NO GROWTH TO DATE CULTURE WILL BE HELD FOR 5 DAYS BEFORE ISSUING A FINAL NEGATIVE REPORT     Performed at [facility]   Report Status PENDING   Incomplete  URINE CULTURE      Status: None   Collection Time    02/14/14 12:03 PM      Result Value Ref Range Status   Specimen Description URINE, CATHETERIZED   Final   Special Requests NONE   Final   Culture  Setup Time     Final   Value: 02/14/2014 17:59     Performed at [place]     Final   Value: NO GROWTH     Performed at [facility]   Culture     Final   Value: NO GROWTH     Performed at [facility]   Report Status 02/15/2014 FINAL   Final     Studies: No results found.  Scheduled Meds: . beta carotene w/minerals  1 tablet Oral Daily  . carvedilol  3.125 mg Oral BID WC  . feeding supplement (ENSURE COMPLETE)  237 mL Oral BID BM  . feeding supplement (ENSURE)  1 Container Oral Q24H  . ferrous gluconate  324 mg Oral BID WC  . furosemide  20 mg Oral Daily  . hydrocortisone  10 mg Oral Daily  . levothyroxine  200 mcg Oral QAC breakfast  . midodrine  5 mg Oral TID WC  . simvastatin  20 mg Oral q1800  . sodium chloride  3 mL Intravenous Q12H   Continuous Infusions:   Time < 30 minutes  Barton Dubois  [facility] Pager [phone]. If 8PM-8AM, please contact night-coverage at [email or web address], password [facility] 02/18/2014, 5:23 PM  LOS: 4 days

## 2014-02-18 NOTE — Progress Notes (Signed)
Palliative medicine consult received. I have spoken with patient's daughter Mariann Laster who has been at the bedside- she wants for patient's son and other daughter to be present or phoned in for a full goals of care discussion. She will return my call today regarding availability tomorrow for a meeting. Will update chart with a time once this is determined-her son is in [place] and her other daughter lives in [place]. Full consult to follow as soon as meeting can be arranged.  Lane Hacker, DO Palliative Medicine

## 2014-02-19 MED ORDER — HALOPERIDOL 1 MG PO TABS
1.0000 mg | ORAL_TABLET | Freq: Every day | ORAL | Status: DC
  Filled 2014-02-19 (×3): qty 1

## 2014-02-19 MED ORDER — HYDROCORTISONE 5 MG PO TABS
5.0000 mg | ORAL_TABLET | Freq: Every day | ORAL | Status: DC
  Administered 2014-02-20 – 2014-02-21 (×2): 5 mg via ORAL
  Filled 2014-02-19 (×2): qty 1

## 2014-02-19 MED ORDER — MIDODRINE HCL 2.5 MG PO TABS
2.5000 mg | ORAL_TABLET | Freq: Two times a day (BID) | ORAL | Status: DC
  Administered 2014-02-19 – 2014-02-21 (×4): 2.5 mg via ORAL
  Filled 2014-02-19 (×7): qty 1

## 2014-02-19 MED ORDER — LORAZEPAM 2 MG/ML PO CONC: 0.5000 mg | ORAL | Status: DC | PRN

## 2014-02-19 MED ORDER — CARVEDILOL 3.125 MG PO TABS
3.1250 mg | ORAL_TABLET | Freq: Two times a day (BID) | ORAL | Status: DC
  Administered 2014-02-21: 3.125 mg via ORAL
  Filled 2014-02-19 (×5): qty 1

## 2014-02-19 MED ORDER — ACETAMINOPHEN 325 MG PO TABS
650.0000 mg | ORAL_TABLET | Freq: Every day | ORAL | Status: DC
  Filled 2014-02-19 (×2): qty 2

## 2014-02-19 MED ORDER — ONDANSETRON HCL 4 MG/2ML IJ SOLN
4.0000 mg | Freq: Four times a day (QID) | INTRAMUSCULAR | Status: DC | PRN
  Administered 2014-02-19: 4 mg via INTRAVENOUS
  Filled 2014-02-19: qty 2

## 2014-02-19 MED ORDER — MORPHINE SULFATE (CONCENTRATE) 10 MG /0.5 ML PO SOLN: 5.0000 mg | ORAL | Status: DC | PRN

## 2014-02-19 NOTE — Progress Notes (Signed)
Patient's BP is 94/69 and is scheduled for Lasix. Patient appears asymptomatic. No signs or symptoms of dizziness noted. MD made aware. Lasix not given. Will continue to monitor patient for further changes in condition.

## 2014-02-19 NOTE — Progress Notes (Signed)
Report given to receiving RN. Patient in bed resting watching TV. No verbal complaints and no signs or symptoms of distress noted.

## 2014-02-19 NOTE — Progress Notes (Signed)
[facility] PROGRESS NOTE Interim History: 80 y.o. female with a past medical history of by systolic and diastolic congestive heart failure having her last transthoracic echocardiogram performed on 01/17/2014 which showed an ejection fraction of 45% and findings consistent with left ventricular diastolic dysfunction. She also has a history of atrial fibrillation, on anticoagulation with pradaxa. She was recently discharged from the medicine service on 01/20/2014 at which time she was evaluated by cardiology having her digoxin was discontinued. There was concern about the high risk of toxicity due to age and renal dysfunction. She presents to the emergency room with complaints of progressive shortness of breath over the past week, associated with weight gain and bilateral lower extremity edema. She has associated cough with white sputum production, however denies fevers, chills, chest pain, nausea vomiting, diarrhea. Family members present at bedside reporting that she has had low blood pressures over the past several weeks for which they have not given metoprolol on a daily basis. Per discharge summary on 01/20/2014 she was discharged on Metoprolol 37.5 mg by mouth twice a day. In the emergency room he was found to be in A. fib with RVR presenting with ventricular rates in the 120s. He was administered IV labetalol after which heart rates improved however she became hypotensive with systolic blood pressures in the 80s. Waiting results of goal of care meeting.   Filed Weights   02/16/14 0700 02/17/14 0543 02/18/14 0405  Weight: 74.4 kg (164 lb 0.4 oz) 73.4 kg (161 lb 13.1 oz) 75.297 kg (166 lb)        Intake/Output Summary (Last 24 hours) at 02/19/14 1352 Last data filed at 02/19/14 1342  Gross per 24 hour  Intake    320 ml  Output    745 ml  Net   -425 ml     Assessment/Plan: 1-Acute on chronic combined systolic and diastolic congestive heart failure. She with history of CHF having  her last transthoracic echocardiogram performed on 01/17/2014 that showed an ejection fraction of 45% with findings suggestive of left ventricular diastolic dysfunction.  -will continue daily weight, strict intake and output -continue low sodium diet -troponin essentially negative except for first one elevated I-stat 0.31 during initial ongoing a. Fib episode -denies CP -continue oxygen supplementation as needed -still with mild LE edema; but breathing improved and no crackles on exam -BMET in am -continue b-blocker; but dose will be adjusted to 3.125 and holding parameters for SBP < 95 -after discussing with family limitations and vicious cycle situation; they will likely to talk with palliative care group and set up advance directives and options for symptomatic treatment.  2-Atrial fibrillation with rapid ventricular response. Patient presenting with ventricular rates in the 120s. She was discharged on beta blocker therapy with metoprolol 37.5 mg twice a day on a recent hospitalization. Family members reporting that they didn't give her metoprolol daily as prescribed due to hypotension. -following cardiology rec's will continue Digoxin and the use of carvedilol (very low dose) -rate is slightly elevated; but BP limiting further tx.  3-acute on chronic renal disease: due to diuresis and hypotension. -will hold lasix and reduce coreg to avoid hypotension -once BP stable will resume low dose PO lasix  4-Hypothyroidism. Having a history of hypothyroidism and on thyroid replacement therapy, now presenting with Afib with RVR. -TSH is 10.930 -will continue synthroid (now adjusted to 228mcg daily)  5-Chronic anticoagulation. Patient on Pradaxa therapy for A. fib.   6-History of dementia with behavioral disturbance. Continue PRN haldol  only  7-Dyslipidemia. Continue statin therapy for now  8-acute urinary retention: will keep foley in place  9-pressure ulcers: stage 1 -overlay mattress  provided -continue preventing measures (constant rotation) -foley for skin integrity and as part of comfort measures  10-hypotension: stable; continue cortef and midrodine  DVT prophylaxis: Patient anticoagulated with pradaxa.    Code Status: Full Family Communication: daughter at bedside Disposition Plan: to be determine.   Consultants:  Cardiology   Palliative care  Procedures: Latest ECHO: 12/2013 - Left ventricle: Small cavity with severe LVH. Decreased thickening of inferior septum. EF is in the 45% range. Findings consistent with left ventricular diastolic dysfunction. - Aortic valve: Moderately severe calcification with mild aortic stenosis. Mean gradient: 42mm Hg (S). Peak gradient: 82mm Hg (S). - Left atrium: The atrium was moderately to severely dilated. - Right ventricle: The cavity size was mildly dilated. Systolic function was moderately reduced. - Right atrium: The atrium was mildly dilated. - Pulmonary arteries: PA peak pressure: 65 mm Hg (S).  Antibiotics:  None   HPI/Subjective: BP soft but stable; patient is AAOX1; remains very weak and frail. HR and fluid level improved. Patient is comfortable.  Objective: Filed Vitals:   02/18/14 2040 02/19/14 0436 02/19/14 1040 02/19/14 1340  BP: 104/68 95/58 94/69  112/67  Pulse: 112 84 98 78  Temp: 97.2 F (36.2 C) 97 F (36.1 C)  97.4 F (36.3 C)  TempSrc: Axillary Axillary  Oral  Resp: 17 18  18   Height:      Weight:      SpO2: 95% 96%  98%     Exam: General: BP soft but stable; patient is AAOX1; remains very weak and frail. HR and fluid level improved. Patient is comfortable. HEENT: No bruits, no goiter; no JVD  Heart: irregular, positive SEM, no rubs or gallops; 1++ edema bilaterally Lungs: improved air movement, no frank crackles Abdomen: Soft, nontender, nondistended, positive bowel sounds.  Neuro: nonfocal; patient mentation at baseline as per family members   Data Reviewed: Basic  Metabolic Panel:  Recent Labs Lab 02/14/14 0930 02/14/14 1426 02/15/14 0105 02/16/14 0633 02/17/14 0536  NA 135*  --  132* 130* 130*  K 4.7  --  4.7 4.9 5.0  CL 97  --  95* 95* 95*  CO2 26  --  24 26 25   GLUCOSE 186*  --  132* 115* 81  BUN 27*  --  29* 34* 39*  CREATININE 1.10 1.05 1.12* 1.36* 1.47*  CALCIUM 9.9  --  9.7 9.6 9.4  MG  --  1.9  --   --   --    Liver Function Tests:  Recent Labs Lab 02/14/14 0930  AST 22  ALT 13  ALKPHOS 110  BILITOT 0.3  PROT 7.5  ALBUMIN 2.4*   CBC:  Recent Labs Lab 02/14/14 0930 02/14/14 1426 02/15/14 0105  WBC 7.0 6.2 5.5  NEUTROABS 5.5  --   --   HGB 11.4* 10.1* 10.8*  HCT 36.8 33.5* 35.6*  MCV 98.1 100.0 97.8  PLT 386 360 403*   Cardiac Enzymes:  Recent Labs Lab 02/14/14 1824 02/15/14 0105  TROPONINI 0.31* <0.30   BNP (last 3 results)  Recent Labs  02/14/14 0930  PROBNP 21821.0*    Recent Results (from the past 240 hour(s))  CULTURE, BLOOD (ROUTINE X 2)     Status: None   Collection Time    02/14/14 10:22 AM      Result Value Ref Range Status   Specimen  Description BLOOD LEFT ANTECUBITAL   Final   Special Requests BOTTLES DRAWN AEROBIC AND ANAEROBIC 10CC   Final   Culture  Setup Time     Final   Value: 02/14/2014 14:32     Performed at [facility]   Culture     Final   Value:        BLOOD CULTURE RECEIVED NO GROWTH TO DATE CULTURE WILL BE HELD FOR 5 DAYS BEFORE ISSUING A FINAL NEGATIVE REPORT     Performed at [facility]   Report Status PENDING   Incomplete  CULTURE, BLOOD (ROUTINE X 2)     Status: None   Collection Time    02/14/14 10:30 AM      Result Value Ref Range Status   Specimen Description BLOOD HAND LEFT   Final   Special Requests BOTTLES DRAWN AEROBIC ONLY 10CC   Final   Culture  Setup Time     Final   Value: 02/14/2014 14:33     Performed at [facility]   Culture     Final   Value:        BLOOD CULTURE RECEIVED NO GROWTH TO DATE CULTURE WILL BE HELD FOR 5  DAYS BEFORE ISSUING A FINAL NEGATIVE REPORT     Performed at [facility]   Report Status PENDING   Incomplete  URINE CULTURE     Status: None   Collection Time    02/14/14 12:03 PM      Result Value Ref Range Status   Specimen Description URINE, CATHETERIZED   Final   Special Requests NONE   Final   Culture  Setup Time     Final   Value: 02/14/2014 17:59     Performed at [place]     Final   Value: NO GROWTH     Performed at [facility]   Culture     Final   Value: NO GROWTH     Performed at [facility]   Report Status 02/15/2014 FINAL   Final     Studies: No results found.  Scheduled Meds: . beta carotene w/minerals  1 tablet Oral Daily  . carvedilol  3.125 mg Oral BID WC  . feeding supplement (ENSURE COMPLETE)  237 mL Oral BID BM  . feeding supplement (ENSURE)  1 Container Oral Q24H  . ferrous gluconate  324 mg Oral BID WC  . furosemide  20 mg Oral Daily  . hydrocortisone  10 mg Oral Daily  . levothyroxine  200 mcg Oral QAC breakfast  . midodrine  5 mg Oral TID WC  . simvastatin  20 mg Oral q1800  . sodium chloride  3 mL Intravenous Q12H   Continuous Infusions:   Time < 30 minutes  Barton Dubois  [facility] Pager [phone]. If 8PM-8AM, please contact night-coverage at [email or web address], password [facility] 02/19/2014, 1:52 PM  LOS: 5 days

## 2014-02-19 NOTE — Progress Notes (Signed)
PT Cancellation Note  Patient Details Name: Hannah Manning MRN: [id number] DOB: 04/27/1921   Cancelled Treatment:    Reason Eval/Treat Not Completed: Medical issues which prohibited therapy. Pt in with MD prior to PT attempt. MD exited room and indicated that pt too nauseated to participate at this time. Will attempt again at a later date.    Jolyn Lent 02/19/2014, 4:00 PM  Jolyn Lent, PT, DPT Acute Rehabilitation Services Pager: [phone]

## 2014-02-19 NOTE — Consult Note (Signed)
Palliative Medicine Team Consult Note  Met with patient's daughter Mariann Laster and conference called in her another daughter Meredith Mody and her son Jori Moll to discuss goals of care.   Summary:  1. DNR 2. Treat reversible conditions as indicated and if they contribute to her comfort and QOL 3. Avoid rehospitalization 4. Return home with caregiver, daughter and Hospice Services 5. Maximize reasonable and indicated medical interventions prior to discharge 6. Symptom Management Recs:  Sublingual Haldol and Lorazepam on discharge prn for agitation/hospice comfort meds if needed  Schedule low dose QHS Haldol for agitation  Comfort Feeding  Zofran ODT for nausea (new complaint today)  Discontinued non-essential meds (stopped multivitamins and iron which could be causing nausea and simvastatin, cut dose of midodrine and frequency-she is on coreg which blocks alpha, cut back on the hydrocortisone as well-can tolerate lower blood pressures if not symnptomatic and given goals of care.   Overall family agreeable to [facility] are just having a hard time processing the medical aspects of her seemingly acute decline, but after a long discussion she has not been doing well for sometime and this is natural progression of vascular dementia along with CHF and refractory A-fib. Will follow closely.    Full note to follow.  Lane Hacker, DO Palliative Medicine

## 2014-02-20 LAB — CULTURE, BLOOD (ROUTINE X 2)
Culture: NO GROWTH
Culture: NO GROWTH

## 2014-02-20 MED ORDER — HYDROCORTISONE 5 MG PO TABS: 5.0000 mg | ORAL_TABLET | Freq: Every day | ORAL | Status: AC

## 2014-02-20 MED ORDER — HALOPERIDOL 0.5 MG PO TABS: 1.0000 mg | ORAL_TABLET | Freq: Every evening | ORAL | Status: AC | PRN

## 2014-02-20 MED ORDER — ONDANSETRON 8 MG PO TBDP: 8.0000 mg | ORAL_TABLET | Freq: Three times a day (TID) | ORAL | Status: AC | PRN

## 2014-02-20 MED ORDER — CARVEDILOL 3.125 MG PO TABS: 3.1250 mg | ORAL_TABLET | Freq: Two times a day (BID) | ORAL | Status: AC

## 2014-02-20 MED ORDER — MIDODRINE HCL 2.5 MG PO TABS: 2.5000 mg | ORAL_TABLET | Freq: Two times a day (BID) | ORAL | Status: AC

## 2014-02-20 MED ORDER — ENSURE PUDDING PO PUDG: 1.0000 | ORAL | Status: AC

## 2014-02-20 MED ORDER — ENSURE COMPLETE PO LIQD: 237.0000 mL | Freq: Two times a day (BID) | ORAL | Status: AC

## 2014-02-20 MED ORDER — LORAZEPAM 2 MG/ML PO CONC: 0.6000 mg | ORAL | Status: AC | PRN

## 2014-02-20 MED ORDER — LEVOTHYROXINE SODIUM 200 MCG PO TABS: 200.0000 ug | ORAL_TABLET | Freq: Every day | ORAL | Status: AC

## 2014-02-20 MED ORDER — FUROSEMIDE 20 MG PO TABS: 20.0000 mg | ORAL_TABLET | Freq: Every day | ORAL | Status: AC

## 2014-02-20 MED ORDER — MORPHINE SULFATE (CONCENTRATE) 10 MG /0.5 ML PO SOLN: 5.0000 mg | ORAL | Status: AC | PRN

## 2014-02-20 NOTE — Progress Notes (Addendum)
Notified by [facility] that family requests services of [facility] of [facility]) after discharge. Patient information reviewed with Dr. Alferd Patee, [place] Director, pt is hospice eligible with Dx of acute on chronic systolic/diastolic heart failure. Write spoke with patient's daughter Mariann Laster [phone]) via phone, initiated education related to hospice services, philosophy and team approach to care with good understanding voiced. Plan per [facility] Crystal is for patient to d/c 5/27 am via non emergent transport.Marland Kitchen DME requested: over bed table and oxygen, pt currently on 4 liters via [facility]. Per Mariann Laster patient currently has hospital bed in the home. Patient PCP is Dr. Lorene Dy MD, [facility] referral center has requested order for hospice from Dr. Mancel Bale and is aware of referral and d/c date. Pharmacy is [facility] on [address].  Completed D/C summary will need to be faxed to [place] [phone]. Please notify [facility] at [phone] at time of discharge. [facility] Contact numbers given to [facility] via phone. \all information shared with Crystal RNCM. Please call with any questions or concerns.  Flo Shanks, RN 02/20/14, 5:00pm [facility] [place] [phone]

## 2014-02-20 NOTE — Progress Notes (Signed)
Physical Therapy Discharge Patient Details Name: Hannah Manning MRN: [id number] DOB: Sep 29, 1920 Today's Date: 02/20/2014 Time:  -     Patient discharged from PT services secondary to medical decline - will need to re-order PT to resume therapy services.  Please see latest therapy progress note for current level of functioning and progress toward goals.    Palliative care consult today, and appears that pt is now comfort care. Per chart review, the plan is to deliver necessary equipment today and d/c pt home tomorrow with hospice care. Pt case discussed with RN who agrees that it would be appropriate to discharge PT services at this time.        Jolyn Lent 02/20/2014, 4:02 PM   Jolyn Lent, PT, DPT Acute Rehabilitation Services Pager: [phone]

## 2014-02-20 NOTE — Progress Notes (Signed)
[facility] PROGRESS NOTE Interim History: 80 y.o. female with a past medical history of by systolic and diastolic congestive heart failure having her last transthoracic echocardiogram performed on 01/17/2014 which showed an ejection fraction of 45% and findings consistent with left ventricular diastolic dysfunction. She also has a history of atrial fibrillation, on anticoagulation with pradaxa. She was recently discharged from the medicine service on 01/20/2014 at which time she was evaluated by cardiology having her digoxin was discontinued. There was concern about the high risk of toxicity due to age and renal dysfunction. She presents to the emergency room with complaints of progressive shortness of breath over the past week, associated with weight gain and bilateral lower extremity edema. She has associated cough with white sputum production, however denies fevers, chills, chest pain, nausea vomiting, diarrhea. Family members present at bedside reporting that she has had low blood pressures over the past several weeks for which they have not given metoprolol on a daily basis. Per discharge summary on 01/20/2014 she was discharged on Metoprolol 37.5 mg by mouth twice a day. In the emergency room he was found to be in A. fib with RVR presenting with ventricular rates in the 120s. He was administered IV labetalol after which heart rates improved however she became hypotensive with systolic blood pressures in the 80s. Plan is to discharge home with hospice care and focus on keeping patient comfortable. Equipment will be delivered today and patient most likely home tomorrow 5/27.   Filed Weights   02/17/14 0543 02/18/14 0405 02/20/14 0527  Weight: 73.4 kg (161 lb 13.1 oz) 75.297 kg (166 lb) 75.751 kg (167 lb)        Intake/Output Summary (Last 24 hours) at 02/20/14 1538 Last data filed at 02/20/14 1408  Gross per 24 hour  Intake    220 ml  Output    150 ml  Net     70 ml      Assessment/Plan: 1-Acute on chronic combined systolic and diastolic congestive heart failure. She with history of CHF having her last transthoracic echocardiogram performed on 01/17/2014 that showed an ejection fraction of 45% with findings suggestive of left ventricular diastolic dysfunction.  -will continue daily weight, strict intake and output -continue low sodium diet -troponin essentially negative except for first one elevated I-stat 0.31 during initial ongoing a. Fib episode -denies CP -continue oxygen supplementation as needed -still with mild LE edema; but breathing improved and no crackles on exam -continue low dose carvedilol and low dose lasix  2-Atrial fibrillation with rapid ventricular response. Patient presenting with ventricular rates in the 120s. She was discharged on beta blocker therapy with metoprolol 37.5 mg twice a day on a recent hospitalization. Family members reporting that they didn't give her metoprolol daily as prescribed due to hypotension. -following cardiology rec's will continue Digoxin and the use of carvedilol (very low dose) -rate is slightly elevated; but BP limiting further tx.  3-acute on chronic renal disease: due to diuresis and hypotension. -will hold lasix and reduce coreg to avoid hypotension -once BP stable will resume low dose PO lasix  4-Hypothyroidism. Having a history of hypothyroidism and on thyroid replacement therapy, now presenting with Afib with RVR. -TSH is 10.930 -will continue synthroid (now adjusted to 231mcg daily)  5-Chronic anticoagulation. Patient on Pradaxa therapy for A. fib.   6-History of dementia with behavioral disturbance. Continue PRN haldol only  7-Dyslipidemia. Continue statin therapy for now  8-acute urinary retention: will keep foley in place  9-pressure ulcers:  stage 1 -overlay mattress provided -continue preventing measures (constant rotation) -foley for skin integrity and as part of comfort  measures  10-hypotension: stable; continue cortef and midrodine  DVT prophylaxis: Patient anticoagulated with pradaxa.    Code Status: Full Family Communication: daughter at bedside Disposition Plan: to be determine.   Consultants:  Cardiology   Palliative care  Procedures: Latest ECHO: 12/2013 - Left ventricle: Small cavity with severe LVH. Decreased thickening of inferior septum. EF is in the 45% range. Findings consistent with left ventricular diastolic dysfunction. - Aortic valve: Moderately severe calcification with mild aortic stenosis. Mean gradient: 17mm Hg (S). Peak gradient: 33mm Hg (S). - Left atrium: The atrium was moderately to severely dilated. - Right ventricle: The cavity size was mildly dilated. Systolic function was moderately reduced. - Right atrium: The atrium was mildly dilated. - Pulmonary arteries: PA peak pressure: 65 mm Hg (S).  Antibiotics:  None   HPI/Subjective: BP soft but stable; patient is AAOX1; remains very weak and frail. HR and fluid level improved. Patient has remained  comfortable.  Objective: Filed Vitals:   02/19/14 2010 02/20/14 0527 02/20/14 1100 02/20/14 1402  BP: 115/71 98/65 94/64  92/61  Pulse: 109 106 94 98  Temp: 98 F (36.7 C) 96.9 F (36.1 C)    TempSrc: Axillary Axillary    Resp: 18 22 22 20   Height:      Weight:  75.751 kg (167 lb)    SpO2: 99% 95% 98% 100%     Exam: General: BP soft but stable; patient is AAOX1; remains very weak and frail. HR and fluid level improved. Patient is comfortable and at this time after [place] meeting plan is to maintain patient comfortable at discharge home with hospice. HEENT: No bruits, no goiter; no JVD  Heart: irregular, positive SEM, no rubs or gallops; 1++ edema bilaterally Lungs: improved air movement, no frank crackles Abdomen: Soft, nontender, nondistended, positive bowel sounds.  Neuro: nonfocal; patient mentation at baseline as per family members   Data  Reviewed: Basic Metabolic Panel:  Recent Labs Lab 02/14/14 0930 02/14/14 1426 02/15/14 0105 02/16/14 0633 02/17/14 0536  NA 135*  --  132* 130* 130*  K 4.7  --  4.7 4.9 5.0  CL 97  --  95* 95* 95*  CO2 26  --  24 26 25   GLUCOSE 186*  --  132* 115* 81  BUN 27*  --  29* 34* 39*  CREATININE 1.10 1.05 1.12* 1.36* 1.47*  CALCIUM 9.9  --  9.7 9.6 9.4  MG  --  1.9  --   --   --    Liver Function Tests:  Recent Labs Lab 02/14/14 0930  AST 22  ALT 13  ALKPHOS 110  BILITOT 0.3  PROT 7.5  ALBUMIN 2.4*   CBC:  Recent Labs Lab 02/14/14 0930 02/14/14 1426 02/15/14 0105  WBC 7.0 6.2 5.5  NEUTROABS 5.5  --   --   HGB 11.4* 10.1* 10.8*  HCT 36.8 33.5* 35.6*  MCV 98.1 100.0 97.8  PLT 386 360 403*   Cardiac Enzymes:  Recent Labs Lab 02/14/14 1824 02/15/14 0105  TROPONINI 0.31* <0.30   BNP (last 3 results)  Recent Labs  02/14/14 0930  PROBNP 21821.0*    Recent Results (from the past 240 hour(s))  CULTURE, BLOOD (ROUTINE X 2)     Status: None   Collection Time    02/14/14 10:22 AM      Result Value Ref Range Status   Specimen  Description BLOOD LEFT ANTECUBITAL   Final   Special Requests BOTTLES DRAWN AEROBIC AND ANAEROBIC 10CC   Final   Culture  Setup Time     Final   Value: 02/14/2014 14:32     Performed at [facility]   Culture     Final   Value: NO GROWTH 5 DAYS     Performed at [facility]   Report Status 02/20/2014 FINAL   Final  CULTURE, BLOOD (ROUTINE X 2)     Status: None   Collection Time    02/14/14 10:30 AM      Result Value Ref Range Status   Specimen Description BLOOD HAND LEFT   Final   Special Requests BOTTLES DRAWN AEROBIC ONLY 10CC   Final   Culture  Setup Time     Final   Value: 02/14/2014 14:33     Performed at [facility]   Culture     Final   Value: NO GROWTH 5 DAYS     Performed at [facility]   Report Status 02/20/2014 FINAL   Final  URINE CULTURE     Status: None   Collection Time     02/14/14 12:03 PM      Result Value Ref Range Status   Specimen Description URINE, CATHETERIZED   Final   Special Requests NONE   Final   Culture  Setup Time     Final   Value: 02/14/2014 17:59     Performed at [facility]     Final   Value: NO GROWTH     Performed at [facility]   Culture     Final   Value: NO GROWTH     Performed at [facility]   Report Status 02/15/2014 FINAL   Final     Studies: No results found.  Scheduled Meds: . acetaminophen  650 mg Oral QHS  . carvedilol  3.125 mg Oral BID  . feeding supplement (ENSURE COMPLETE)  237 mL Oral BID BM  . feeding supplement (ENSURE)  1 Container Oral Q24H  . furosemide  20 mg Oral Daily  . haloperidol  1 mg Oral QHS  . hydrocortisone  5 mg Oral Daily  . levothyroxine  200 mcg Oral QAC breakfast  . midodrine  2.5 mg Oral BID WC  . sodium chloride  3 mL Intravenous Q12H   Continuous Infusions:   Time < 30 minutes  Barton Dubois  [facility] Pager [phone]. If 8PM-8AM, please contact night-coverage at [email or web address], password [email or web address] 02/20/2014, 3:38 PM  LOS: 6 days

## 2014-02-20 NOTE — Discharge Summary (Signed)
Physician Discharge Summary  GILLERMINA MAIERS [id number] DOB: October 19, 1920 DOA: 02/14/2014  PCP: Myriam Jacobson, MD  Admit date: 02/14/2014 Discharge date: 02/20/2014  Time spent: >30 minutes  Recommendations for Outpatient Follow-up:  Hospice care with main goal of symptomatic treatments and comfort.  Discharge Diagnoses:  Acute on chronic combined systolic/diastolic congestive heart failure Atrial fibrillation with RVR Acute on chronic Renal insufficiency Dementia with behavioral disturbance Urinary retention Severe protein calorie malnutrition Dyslipidemia Hypothyroidism Stage I pressure ulcer History of chronic anticoagulation Weakness/deconditioning  Discharge Condition: Stable. Patient without complaints of shortness of breath, chest pain, abdominal pain, nausea or vomiting. Baby comfortable overall. Will be discharged home with hospice care.  Diet recommendation: Modify-sodium diet (comfort feeding).  Filed Weights   02/17/14 0543 02/18/14 0405 02/20/14 0527  Weight: 73.4 kg (161 lb 13.1 oz) 75.297 kg (166 lb) 75.751 kg (167 lb)    History of present illness:  80 y.o. female with a past medical history of by systolic and diastolic congestive heart failure having her last transthoracic echocardiogram performed on 01/17/2014 which showed an ejection fraction of 45% and findings consistent with left ventricular diastolic dysfunction. She also has a history of atrial fibrillation, on anticoagulation with pradaxa. She was recently discharged from the medicine service on 01/20/2014 at which time she was evaluated by cardiology having her digoxin was discontinued. There was concern about the high risk of toxicity due to age and renal dysfunction. She presents to the emergency room with complaints of progressive shortness of breath over the past week, associated with weight gain and bilateral lower extremity edema. She has associated cough with white sputum production,  however denies fevers, chills, chest pain, nausea vomiting, diarrhea. Family members present at bedside reporting that she has had low blood pressures over the past several weeks for which they have not given metoprolol on a daily basis. Per discharge summary on 01/20/2014 she was discharged on Metoprolol 37.5 mg by mouth twice a day. In the emergency room he was found to be in A. fib with RVR presenting with ventricular rates in the 120s.   Hospital Course:  1-Acute on chronic combined systolic and diastolic congestive heart failure. She with history of CHF having her last transthoracic echocardiogram performed on 01/17/2014 that showed an ejection fraction of 45% with findings suggestive of left ventricular diastolic dysfunction.  -Base on outcome GOC meeting, plan is to discharge on low-dose Lasix, low dose carvedilol and in order to help maintaining blood pressure stable will use low-dose Cortef and midrodine.  -Patient will continue low sodium diet for comfort feeding -Ativan and oral morphine has been provided a spinal comfort measures for shortness of breath and agitation that the patient called develop after discharge. -Oxygen has been provided to assist with comfort care. -Hospice will follow the patient along for further measures.  2-Atrial fibrillation with rapid ventricular response. Patient presenting with ventricular rates in the 120s. She was discharged on beta blocker therapy with metoprolol 37.5 mg twice a day on a recent hospitalization. Family members reporting that they didn't give her metoprolol daily as prescribed due to hypotension at that time. -Base on outcome GOC meeting, plan is to discontinue digoxin as initially recommended by cardiology. Will continue low-dose carvedilol and we'll focus on comfort care/symptoms management.  3-acute on chronic renal disease: due to diuresis and hypotension.  -Has remained stable. -Base on outcome GOC meeting, plan is to continue daily low  dose of Lasix -No further labs will be drawn; plan is for  no rehospitalizations.  4-Hypothyroidism. Having a history of hypothyroidism and on thyroid replacement therapy, now presenting with Afib with RVR.  -TSH was 10.930  -will continue synthroid (dose now adjusted to 262mcg daily)   5-Chronic anticoagulation. Base on outcome GOC meeting, plan is to discontinue Pradaxa  6-History of dementia with behavioral disturbance. Continue haldol at bedtime (1 mg)    7-Dyslipidemia. Base on outcome GOC meeting, plan is to discontinue statins at this point.  8-acute urinary retention: will keep and discharge with foley in place   9-pressure ulcers: stage 1  -overlay mattress provided  -continue preventing measures (constant rotation) and application of barrier creams -foley for skin integrity and as part of comfort measures   10-hypotension: stable; continue cortef and midrodine  11-severe protein calorie malnutrition: Continue feeding supplement as part of comfort feedings.  Procedures:  See below for x-ray reports  Reviewed 2-D echo: 12/2013 -Left ventricle: Small cavity with severe LVH. Decreased thickening of inferior septum. EF is in the 45% range. Findings consistent with left ventricular diastolic dysfunction. - Aortic valve: Moderately severe calcification with mild aortic stenosis. Mean gradient: 80mm Hg (S). Peak gradient: 66mm Hg (S). - Left atrium: The atrium was moderately to severely dilated. - Right ventricle: The cavity size was mildly dilated. Systolic function was moderately reduced. - Right atrium: The atrium was mildly dilated. - Pulmonary arteries: PA peak pressure: 65 mm Hg (S).  Consultations:  Palliative care service  Discharge Exam: Filed Vitals:   02/20/14 1402  BP: 92/61  Pulse: 98  Temp:   Resp: 20    General: BP soft but stable; patient is AAOX1; remains very weak and frail. HR and fluid level improved. Patient is comfortable.  HEENT: No  bruits, no goiter; no JVD  Heart: irregular, positive SEM, no rubs or gallops; 1++ edema bilaterally  Lungs: improved air movement, no frank crackles  Abdomen: Soft, nontender, nondistended, positive bowel sounds.  Neuro: nonfocal; patient mentation at baseline as per family members   Discharge Instructions You were cared for by a hospitalist during your hospital stay. If you have any questions about your discharge medications or the care you received while you were in the hospital after you are discharged, you can call the unit and asked to speak with the hospitalist on call if the hospitalist that took care of you is not available. Once you are discharged, your primary care physician will handle any further medical issues. Please note that NO REFILLS for any discharge medications will be authorized once you are discharged, as it is imperative that you return to your primary care physician (or establish a relationship with a primary care physician if you do not have one) for your aftercare needs so that they can reassess your need for medications and monitor your lab values.  Discharge Instructions   Discharge instructions    Complete by:  As directed   Take Medication as prescribed Hospice to follow patient at home and continue modifying medication regimen to achieve desired level of comfort Low-sodium diet (comfort feeding).            Medication List    STOP taking these medications       allopurinol 100 MG tablet  Commonly known as:  ZYLOPRIM     beta carotene w/minerals tablet     dabigatran 75 MG Caps capsule  Commonly known as:  PRADAXA     ferrous gluconate 324 MG tablet  Commonly known as:  [employer]  metoprolol tartrate 25 MG tablet  Commonly known as:  LOPRESSOR     simvastatin 20 MG tablet  Commonly known as:  ZOCOR      TAKE these medications       acetaminophen 500 MG tablet  Commonly known as:  TYLENOL  Take 1,000 mg by mouth See admin instructions. Takes  1000 mg every morning and an 100 mg in the evening as needed for pain     carvedilol 3.125 MG tablet  Commonly known as:  COREG  Take 1 tablet (3.125 mg total) by mouth 2 (two) times daily.     feeding supplement (ENSURE COMPLETE) Liqd  Take 237 mLs by mouth 2 (two) times daily between meals.     feeding supplement (ENSURE) Pudg  Take 1 Container by mouth daily.     furosemide 20 MG tablet  Commonly known as:  LASIX  Take 1 tablet (20 mg total) by mouth daily.     haloperidol 0.5 MG tablet  Commonly known as:  HALDOL  Take 2 tablets (1 mg total) by mouth at bedtime as needed (to calm her down).     hydrocortisone 5 MG tablet  Commonly known as:  CORTEF  Take 1 tablet (5 mg total) by mouth daily.     levothyroxine 200 MCG tablet  Commonly known as:  SYNTHROID, LEVOTHROID  Take 1 tablet (200 mcg total) by mouth daily before breakfast.     LORazepam 2 MG/ML concentrated solution  Commonly known as:  ATIVAN  Take 0.3 mLs (0.6 mg total) by mouth every 4 (four) hours as needed for anxiety, seizure, sedation or sleep.     midodrine 2.5 MG tablet  Commonly known as:  PROAMATINE  Take 1 tablet (2.5 mg total) by mouth 2 (two) times daily with a meal.     morphine CONCENTRATE 10 mg / 0.5 ml concentrated solution  Take 0.25 mLs (5 mg total) by mouth every 2 (two) hours as needed for moderate pain, severe pain, anxiety or shortness of breath.     ondansetron 8 MG disintegrating tablet  Commonly known as:  ZOFRAN ODT  Take 1 tablet (8 mg total) by mouth every 8 (eight) hours as needed for nausea or vomiting.     SYSTANE OP  Place 1 drop into both eyes every morning.       No Known Allergies   The results of significant diagnostics from this hospitalization (including imaging, microbiology, ancillary and laboratory) are listed below for reference.    Significant Diagnostic Studies: Dg Chest Port 1 View  02/14/2014   CLINICAL DATA:  Shortness of breath  EXAM: PORTABLE CHEST - 1  VIEW  COMPARISON:  DG CHEST 1V PORT dated 01/16/2014  FINDINGS: There are bilateral interstitial and alveolar airspace opacities. There are bilateral small pleural effusions, right greater than left. There is no pneumothorax. There is stable cardiomegaly. The osseous structures are unremarkable. There are surgical clips in the left axilla from prior axillary dissection.  IMPRESSION: Bilateral interstitial and alveolar airspace opacities with bilateral small pleural effusions concerning for pulmonary edema. Alternatively multi lobar pneumonia can have a similar appearance.   Electronically Signed   By: Kathreen Devoid   On: 02/14/2014 10:30    Microbiology: Recent Results (from the past 240 hour(s))  CULTURE, BLOOD (ROUTINE X 2)     Status: None   Collection Time    02/14/14 10:22 AM      Result Value Ref Range Status   Specimen Description BLOOD  LEFT ANTECUBITAL   Final   Special Requests BOTTLES DRAWN AEROBIC AND ANAEROBIC 10CC   Final   Culture  Setup Time     Final   Value: 02/14/2014 14:32     Performed at [facility]   Culture     Final   Value: NO GROWTH 5 DAYS     Performed at [facility]   Report Status 02/20/2014 FINAL   Final  CULTURE, BLOOD (ROUTINE X 2)     Status: None   Collection Time    02/14/14 10:30 AM      Result Value Ref Range Status   Specimen Description BLOOD HAND LEFT   Final   Special Requests BOTTLES DRAWN AEROBIC ONLY 10CC   Final   Culture  Setup Time     Final   Value: 02/14/2014 14:33     Performed at [facility]   Culture     Final   Value: NO GROWTH 5 DAYS     Performed at [facility]   Report Status 02/20/2014 FINAL   Final  URINE CULTURE     Status: None   Collection Time    02/14/14 12:03 PM      Result Value Ref Range Status   Specimen Description URINE, CATHETERIZED   Final   Special Requests NONE   Final   Culture  Setup Time     Final   Value: 02/14/2014 17:59     Performed at [place]     Final   Value: NO GROWTH     Performed at [facility]   Culture     Final   Value: NO GROWTH     Performed at [facility]   Report Status 02/15/2014 FINAL   Final     Labs: Basic Metabolic Panel:  Recent Labs Lab 02/14/14 0930 02/14/14 1426 02/15/14 0105 02/16/14 0633 02/17/14 0536  NA 135*  --  132* 130* 130*  K 4.7  --  4.7 4.9 5.0  CL 97  --  95* 95* 95*  CO2 26  --  24 26 25   GLUCOSE 186*  --  132* 115* 81  BUN 27*  --  29* 34* 39*  CREATININE 1.10 1.05 1.12* 1.36* 1.47*  CALCIUM 9.9  --  9.7 9.6 9.4  MG  --  1.9  --   --   --    Liver Function Tests:  Recent Labs Lab 02/14/14 0930  AST 22  ALT 13  ALKPHOS 110  BILITOT 0.3  PROT 7.5  ALBUMIN 2.4*   CBC:  Recent Labs Lab 02/14/14 0930 02/14/14 1426 02/15/14 0105  WBC 7.0 6.2 5.5  NEUTROABS 5.5  --   --   HGB 11.4* 10.1* 10.8*  HCT 36.8 33.5* 35.6*  MCV 98.1 100.0 97.8  PLT 386 360 403*   Cardiac Enzymes:  Recent Labs Lab 02/14/14 1824 02/15/14 0105  TROPONINI 0.31* <0.30   BNP: BNP (last 3 results)  Recent Labs  02/14/14 0930  PROBNP 21821.0*    Signed:  Barton Dubois  [facility] 02/20/2014, 3:49 PM

## 2014-02-20 NOTE — Progress Notes (Signed)
UR completed Miroslav Gin K. Welles Walthall, RN, BSN, [place], CCM  02/20/2014 12:26 PM

## 2014-02-21 NOTE — Progress Notes (Signed)
NUTRITION FOLLOW UP  Intervention:   Continue Ensure Complete BID Continue Ensure Pudding once daily  Nutrition Dx:   Inadequate oral intake related to poor appetite as evidenced by meal intake 5-30%; ongoing  Goal:   Pt to meet >/= 90% of their estimated nutrition needs; discontinued  New Goal: Comfort feedings, with continued use of nutritional supplements  Monitor:   PO intake, weight trend, labs  Assessment:   80 y.o. female with a past medical history of by systolic and diastolic congestive heart failure having her last transthoracic echocardiogram performed on 01/17/2014 which showed an ejection fraction of 45% and findings consistent with left ventricular diastolic dysfunction. She was recently discharged from the medicine service on 01/20/2014 at which time she was evaluated by cardiology having her digoxin was discontinued. There was concern about the high risk of toxicity due to age and renal dysfunction. She presents to the emergency room with complaints of progressive shortness of breath over the past week, associated with weight gain and bilateral lower extremity edema. She has associated cough with white sputum production, however denies fevers, chills, chest pain, nausea vomiting, diarrhea.   5/21: Pt has history of dementia and was unable to answer questions at time of visit. Per pt's daughter at bedside, pt has only been eating a few bites per meal. Pt has been drinking Ensure Plus at home. No weight loss in the past month per weight history. Pt with some moderate wasting in arms and temples. Per nursing notes, pt ate 5% of breakfast this morning and 25-30% of meals yesterday. Pt with stage 2 PU to left leg.   5/27: Yesterday pt's PO intake improved, per nursing notes pt ate 40-60% of meals. Per documentation pt has been accepting about 50-75% of supplements. Pt's weight has trended up 7 lbs since admission; ? Accuracy. Pt had a palliative [place] meeting on 5/25 with decision for  comfort feeding. Per MD notes, continue feeding supplements as part of comfort feedings.    Height: Ht Readings from Last 1 Encounters:  02/14/14 5\' 5"  (1.651 m)    Weight Status:   Wt Readings from Last 1 Encounters:  02/21/14 167 lb 15.9 oz (76.2 kg)    Re-estimated needs:  Kcal: 1700-1900  Protein: 80-90 grams  Fluid: 1.7-1.9 L/day   Skin: stage 2 pressure ulcer on left leg; +1 generalized edema   Diet Order: General   Intake/Output Summary (Last 24 hours) at 02/21/14 1103 Last data filed at 02/21/14 0300  Gross per 24 hour  Intake      0 ml  Output    150 ml  Net   -150 ml    Last BM: 5/23   Labs:   Recent Labs Lab 02/14/14 1426 02/15/14 0105 02/16/14 0633 02/17/14 0536  NA  --  132* 130* 130*  K  --  4.7 4.9 5.0  CL  --  95* 95* 95*  CO2  --  24 26 25   BUN  --  29* 34* 39*  CREATININE 1.05 1.12* 1.36* 1.47*  CALCIUM  --  9.7 9.6 9.4  MG 1.9  --   --   --   GLUCOSE  --  132* 115* 81    CBG (last 3)  No results found for this basename: GLUCAP,  in the last 72 hours  Scheduled Meds: . acetaminophen  650 mg Oral QHS  . carvedilol  3.125 mg Oral BID  . feeding supplement (ENSURE COMPLETE)  237 mL Oral BID BM  .  feeding supplement (ENSURE)  1 Container Oral Q24H  . furosemide  20 mg Oral Daily  . haloperidol  1 mg Oral QHS  . hydrocortisone  5 mg Oral Daily  . levothyroxine  200 mcg Oral QAC breakfast  . midodrine  2.5 mg Oral BID WC  . sodium chloride  3 mL Intravenous Q12H    Continuous Infusions:   Pryor Ochoa RD, LDN Inpatient Clinical Dietitian Pager: [phone] After Hours Pager: [phone]

## 2014-02-21 NOTE — Progress Notes (Signed)
[facility] PROGRESS NOTE Assessment/Plan: Acute on chronic combined systolic and diastolic congestive heart failure.  - Base on outcome GOC meeting, plan is to discharge on low-dose Lasix, low dose carvedilol and in order to help maintaining blood pressure stable will use low-dose Cortef and midrodine.  - Ativan and oral morphine has been provided a spinal comfort measures for shortness of breath and agitation that the patient called develop after discharge.  - Oxygen has been provided to assist with comfort care.  - Hospice will follow the patient along for further measures.  - awaiting discharge.  Atrial fibrillation with rapid ventricular response:  - Base on outcome GOC meeting, plan is to discontinue digoxin as initially recommended by cardiology. Will continue low-dose carvedilol and we'll focus on comfort care/symptoms management.   Acute on chronic renal disease:  - due to diuresis and hypotension.  - Base on outcome GOC meeting, plan is to continue daily low dose of Lasix   Hypothyroidism: Having a history of hypothyroidism and on thyroid replacement therapy, now presenting with Afib with RVR.  -TSH was 10.930  -will continue synthroid (dose now adjusted to 239mcg daily)   Chronic anticoagulation: - Base on outcome GOC meeting, plan is to discontinue Pradaxa.  Dementia with behavioral disturbance.  - Continue haldol at bedtime (1 mg)   Dyslipidemia.  - Base on outcome GOC meeting, plan is to discontinue statins at this point.   severe protein calorie malnutrition:  - Continue feeding supplement as part of comfort feedings.     Code Status: DNR Family Communication: none  Disposition Plan: inaptient   Consultants:  cardiology  Procedures:  CXR  Antibiotics:  none  HPI/Subjective: No complains  Objective: Filed Vitals:   02/20/14 1100 02/20/14 1402 02/20/14 2033 02/21/14 0432  BP: 94/64 92/61 98/68  102/69  Pulse: 94 98 103 69  Temp:     97.1 F (36.2 C)  TempSrc:    Axillary  Resp: 22 20 19 18   Height:      Weight:    76.2 kg (167 lb 15.9 oz)  SpO2: 98% 100% 100% 99%    Intake/Output Summary (Last 24 hours) at 02/21/14 0956 Last data filed at 02/21/14 0300  Gross per 24 hour  Intake      0 ml  Output    150 ml  Net   -150 ml   Filed Weights   02/18/14 0405 02/20/14 0527 02/21/14 0432  Weight: 75.297 kg (166 lb) 75.751 kg (167 lb) 76.2 kg (167 lb 15.9 oz)    Exam:  General: Alert, awake, oriented x1, in no acute distress.  HEENT: No bruits, no goiter.  Heart: Regular rate and rhythm,+JVD Lungs: Good air movement, crackles bilaterally Abdomen: Soft, nontender, nondistended, positive bowel sounds.    Data Reviewed: Basic Metabolic Panel:  Recent Labs Lab 02/14/14 1426 02/15/14 0105 02/16/14 0633 02/17/14 0536  NA  --  132* 130* 130*  K  --  4.7 4.9 5.0  CL  --  95* 95* 95*  CO2  --  24 26 25   GLUCOSE  --  132* 115* 81  BUN  --  29* 34* 39*  CREATININE 1.05 1.12* 1.36* 1.47*  CALCIUM  --  9.7 9.6 9.4  MG 1.9  --   --   --    Liver Function Tests: No results found for this basename: AST, ALT, ALKPHOS, BILITOT, PROT, ALBUMIN,  in the last 168 hours No results found for this basename: LIPASE, AMYLASE,  in  the last 168 hours No results found for this basename: AMMONIA,  in the last 168 hours CBC:  Recent Labs Lab 02/14/14 1426 02/15/14 0105  WBC 6.2 5.5  HGB 10.1* 10.8*  HCT 33.5* 35.6*  MCV 100.0 97.8  PLT 360 403*   Cardiac Enzymes:  Recent Labs Lab 02/14/14 1824 02/15/14 0105  TROPONINI 0.31* <0.30   BNP (last 3 results)  Recent Labs  02/14/14 0930  PROBNP 21821.0*   CBG: No results found for this basename: GLUCAP,  in the last 168 hours  Recent Results (from the past 240 hour(s))  CULTURE, BLOOD (ROUTINE X 2)     Status: None   Collection Time    02/14/14 10:22 AM      Result Value Ref Range Status   Specimen Description BLOOD LEFT ANTECUBITAL   Final   Special  Requests BOTTLES DRAWN AEROBIC AND ANAEROBIC 10CC   Final   Culture  Setup Time     Final   Value: 02/14/2014 14:32     Performed at [facility]   Culture     Final   Value: NO GROWTH 5 DAYS     Performed at [facility]   Report Status 02/20/2014 FINAL   Final  CULTURE, BLOOD (ROUTINE X 2)     Status: None   Collection Time    02/14/14 10:30 AM      Result Value Ref Range Status   Specimen Description BLOOD HAND LEFT   Final   Special Requests BOTTLES DRAWN AEROBIC ONLY 10CC   Final   Culture  Setup Time     Final   Value: 02/14/2014 14:33     Performed at [place]     Final   Value: NO GROWTH 5 DAYS     Performed at [facility]   Report Status 02/20/2014 FINAL   Final  URINE CULTURE     Status: None   Collection Time    02/14/14 12:03 PM      Result Value Ref Range Status   Specimen Description URINE, CATHETERIZED   Final   Special Requests NONE   Final   Culture  Setup Time     Final   Value: 02/14/2014 17:59     Performed at [place]     Final   Value: NO GROWTH     Performed at [facility]   Culture     Final   Value: NO GROWTH     Performed at [facility]   Report Status 02/15/2014 FINAL   Final     Studies: No results found.  Scheduled Meds: . acetaminophen  650 mg Oral QHS  . carvedilol  3.125 mg Oral BID  . feeding supplement (ENSURE COMPLETE)  237 mL Oral BID BM  . feeding supplement (ENSURE)  1 Container Oral Q24H  . furosemide  20 mg Oral Daily  . haloperidol  1 mg Oral QHS  . hydrocortisone  5 mg Oral Daily  . levothyroxine  200 mcg Oral QAC breakfast  . midodrine  2.5 mg Oral BID WC  . sodium chloride  3 mL Intravenous Q12H   Continuous Infusions:    [facility] Pager [phone]. If 8PM-8AM, please contact night-coverage at [email or web address], password [facility] 02/21/2014, 9:56 AM  LOS: 7 days

## 2014-02-21 NOTE — Progress Notes (Signed)
1410 discharge to home via [facility] transport . All equipment and supplies ready and available as per social l worker ,Butch Penny

## 2014-02-21 NOTE — Progress Notes (Signed)
0730  [place] ,  Alert  , responded appropriately to simple queries . Pt hard of hearing.

## 2014-02-26 DEATH — deceased

## 2014-07-04 IMAGING — CR DG KNEE COMPLETE 4+V*R*
4 series · 4 of 4 positions shown · non-contrast
Comparison: DG KNEE 2 VIEWS*R* dated 03/26/2010

CLINICAL DATA: Right knee pain and swelling

EXAM:
RIGHT KNEE - COMPLETE 4+ VIEW

[x knee lat right (1 of 4)]
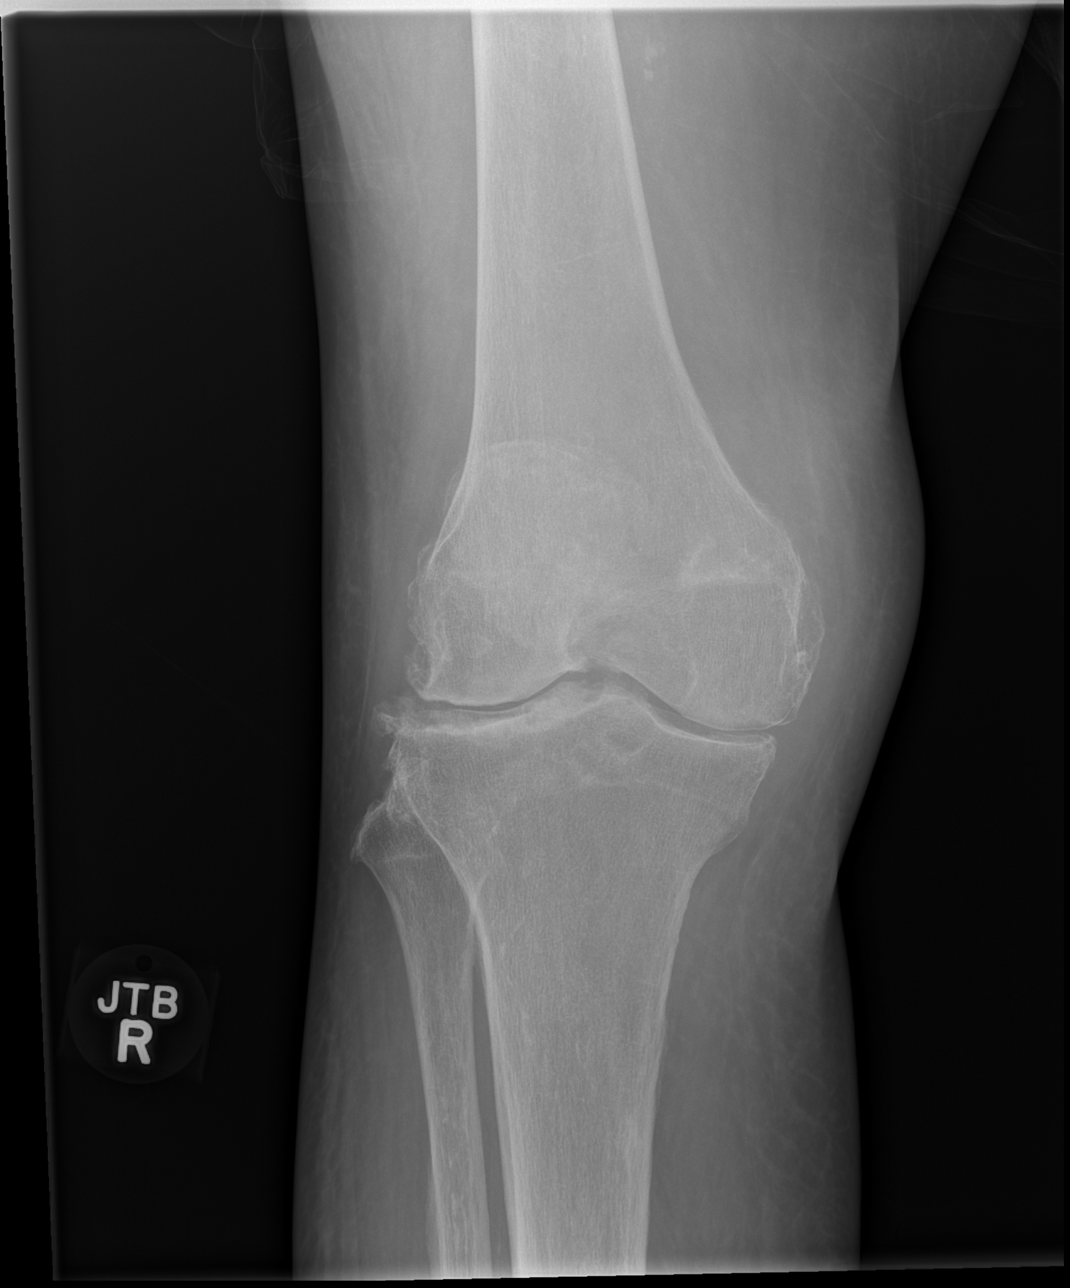

[x knee lat right (2 of 4)]
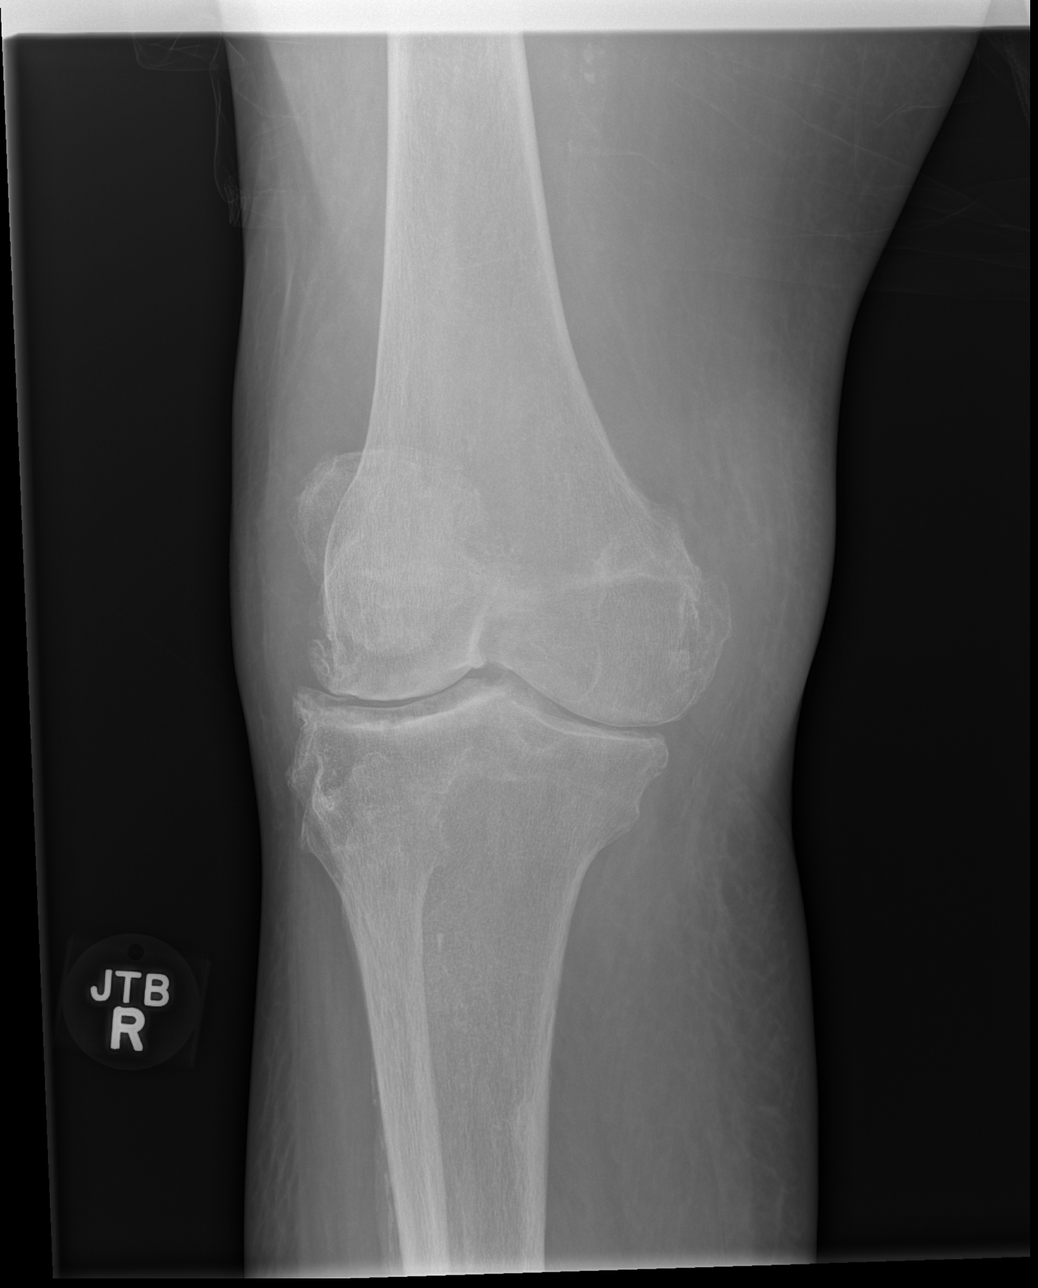

[x knee lat right (3 of 4)]
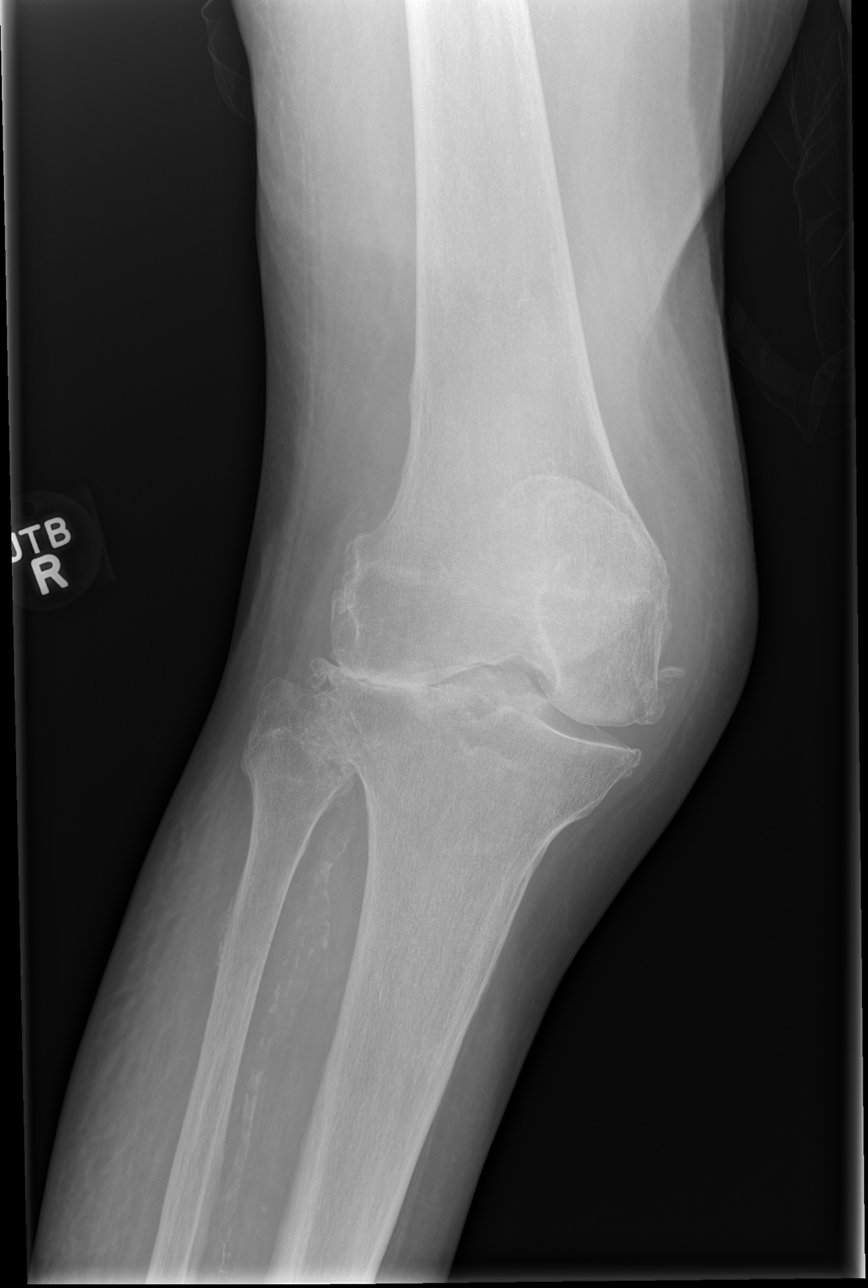

[x knee lat right (4 of 4)]
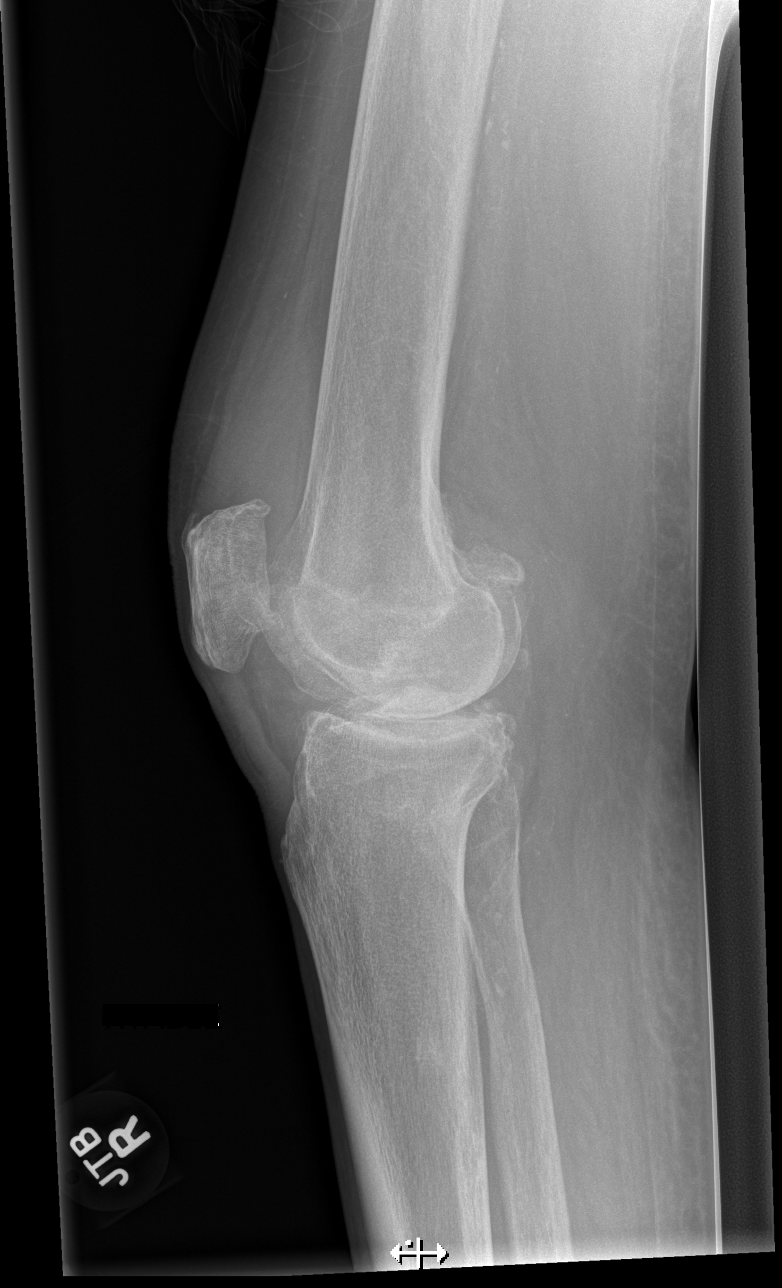

[4 of 4 positions shown; findings below may reference images not displayed]

FINDINGS: Moderate narrowing of the medial compartment and severe narrowing of
the lateral compartment with mild medial and moderate to severe
lateral osteophyte formation. Moderate narrowing of the
patellofemoral compartment with moderate osteophyte formation.
Moderate joint effusion present. No fracture or dislocation.
Extensive below the knee and above the knee vascular calcification
present.
IMPRESSION: Moderate to severe arthritis, mildly worse in the medial and
patellofemoral compartments when compared to the prior study.
